# Patient Record
Sex: Female | Born: 1935 | Hispanic: No | Smoking: Never smoker
Health system: Southern US, Community
[De-identification: ages and names within clinical notes are randomized; demographics above are authoritative.]

## PROBLEM LIST (undated history)

## (undated) DIAGNOSIS — E785 Hyperlipidemia, unspecified: Secondary | ICD-10-CM

## (undated) DIAGNOSIS — I10 Essential (primary) hypertension: Secondary | ICD-10-CM

## (undated) DIAGNOSIS — I442 Atrioventricular block, complete: Secondary | ICD-10-CM

## (undated) DIAGNOSIS — H409 Unspecified glaucoma: Secondary | ICD-10-CM

## (undated) DIAGNOSIS — I4892 Unspecified atrial flutter: Secondary | ICD-10-CM

## (undated) HISTORY — DX: Unspecified atrial flutter: I48.92

## (undated) HISTORY — DX: Atrioventricular block, complete: I44.2

## (undated) HISTORY — DX: Hyperlipidemia, unspecified: E78.5

---

## 2019-07-21 ENCOUNTER — Emergency Department (HOSPITAL_COMMUNITY): Payer: Self-pay

## 2019-07-21 ENCOUNTER — Inpatient Hospital Stay (HOSPITAL_COMMUNITY)
Admission: EM | Admit: 2019-07-21 | Discharge: 2019-07-22 | DRG: 103 | Disposition: A | Discharge status: Home / Self Care | Payer: Self-pay | Attending: Internal Medicine | Admitting: Internal Medicine

## 2019-07-21 ENCOUNTER — Other Ambulatory Visit: Payer: Self-pay

## 2019-07-21 ENCOUNTER — Encounter (HOSPITAL_COMMUNITY): Payer: Self-pay

## 2019-07-21 DIAGNOSIS — I10 Essential (primary) hypertension: Secondary | ICD-10-CM | POA: Diagnosis present

## 2019-07-21 DIAGNOSIS — R519 Headache, unspecified: Secondary | ICD-10-CM

## 2019-07-21 DIAGNOSIS — G43909 Migraine, unspecified, not intractable, without status migrainosus: Principal | ICD-10-CM | POA: Diagnosis present

## 2019-07-21 DIAGNOSIS — R4182 Altered mental status, unspecified: Secondary | ICD-10-CM | POA: Diagnosis present

## 2019-07-21 DIAGNOSIS — Z20828 Contact with and (suspected) exposure to other viral communicable diseases: Secondary | ICD-10-CM | POA: Diagnosis present

## 2019-07-21 DIAGNOSIS — E785 Hyperlipidemia, unspecified: Secondary | ICD-10-CM | POA: Diagnosis present

## 2019-07-21 DIAGNOSIS — K219 Gastro-esophageal reflux disease without esophagitis: Secondary | ICD-10-CM | POA: Diagnosis present

## 2019-07-21 DIAGNOSIS — Z79899 Other long term (current) drug therapy: Secondary | ICD-10-CM

## 2019-07-21 DIAGNOSIS — H409 Unspecified glaucoma: Secondary | ICD-10-CM | POA: Diagnosis present

## 2019-07-21 DIAGNOSIS — F0390 Unspecified dementia without behavioral disturbance: Secondary | ICD-10-CM | POA: Diagnosis present

## 2019-07-21 HISTORY — DX: Unspecified glaucoma: H40.9

## 2019-07-21 HISTORY — DX: Essential (primary) hypertension: I10

## 2019-07-21 LAB — URINALYSIS, ROUTINE W REFLEX MICROSCOPIC
Bacteria, UA: NONE SEEN
Bilirubin Urine: NEGATIVE
Glucose, UA: NEGATIVE mg/dL
Ketones, ur: NEGATIVE mg/dL
Nitrite: NEGATIVE
Protein, ur: 30 mg/dL — AB
Specific Gravity, Urine: 1.015 (ref 1.005–1.030)
pH: 7 (ref 5.0–8.0)

## 2019-07-21 LAB — CBC WITH DIFFERENTIAL/PLATELET
Abs Immature Granulocytes: 0.02 10*3/uL (ref 0.00–0.07)
Basophils Absolute: 0 10*3/uL (ref 0.0–0.1)
Basophils Relative: 0 %
Eosinophils Absolute: 0 10*3/uL (ref 0.0–0.5)
Eosinophils Relative: 0 %
HCT: 34.6 % — ABNORMAL LOW (ref 36.0–46.0)
Hemoglobin: 11.8 g/dL — ABNORMAL LOW (ref 12.0–15.0)
Immature Granulocytes: 0 %
Lymphocytes Relative: 24 %
Lymphs Abs: 1.6 10*3/uL (ref 0.7–4.0)
MCH: 30.6 pg (ref 26.0–34.0)
MCHC: 34.1 g/dL (ref 30.0–36.0)
MCV: 89.9 fL (ref 80.0–100.0)
Monocytes Absolute: 0.3 10*3/uL (ref 0.1–1.0)
Monocytes Relative: 5 %
Neutro Abs: 4.7 10*3/uL (ref 1.7–7.7)
Neutrophils Relative %: 71 %
Platelets: 131 10*3/uL — ABNORMAL LOW (ref 150–400)
RBC: 3.85 MIL/uL — ABNORMAL LOW (ref 3.87–5.11)
RDW: 12.4 % (ref 11.5–15.5)
WBC: 6.7 10*3/uL (ref 4.0–10.5)
nRBC: 0 % (ref 0.0–0.2)

## 2019-07-21 LAB — BASIC METABOLIC PANEL
Anion gap: 12 (ref 5–15)
BUN: 11 mg/dL (ref 8–23)
CO2: 27 mmol/L (ref 22–32)
Calcium: 9.5 mg/dL (ref 8.9–10.3)
Chloride: 100 mmol/L (ref 98–111)
Creatinine, Ser: 1.01 mg/dL — ABNORMAL HIGH (ref 0.44–1.00)
GFR calc Af Amer: 58 mL/min — ABNORMAL LOW (ref >60)
GFR calc non Af Amer: 50 mL/min — ABNORMAL LOW (ref >60)
Glucose, Bld: 109 mg/dL — ABNORMAL HIGH (ref 70–99)
Potassium: 4 mmol/L (ref 3.5–5.1)
Sodium: 139 mmol/L (ref 135–145)

## 2019-07-21 LAB — RAPID URINE DRUG SCREEN, HOSP PERFORMED
Amphetamines: NOT DETECTED
Barbiturates: NOT DETECTED
Benzodiazepines: NOT DETECTED
Cocaine: NOT DETECTED
Opiates: POSITIVE — AB
Tetrahydrocannabinol: NOT DETECTED

## 2019-07-21 LAB — PROTIME-INR
INR: 1 (ref 0.8–1.2)
Prothrombin Time: 13.2 seconds (ref 11.4–15.2)

## 2019-07-21 LAB — ETHANOL: Alcohol, Ethyl (B): <10 mg/dL (ref <10)

## 2019-07-21 LAB — APTT: aPTT: 23 seconds — ABNORMAL LOW (ref 24–36)

## 2019-07-21 MED ORDER — SODIUM CHLORIDE 0.9 % IV SOLN
INTRAVENOUS | Status: AC
  Administered 2019-07-21: 21:00:00 via INTRAVENOUS

## 2019-07-21 MED ORDER — PROCHLORPERAZINE EDISYLATE 10 MG/2ML IJ SOLN
10.0000 mg | Freq: Once | INTRAMUSCULAR | Status: DC
  Filled 2019-07-21: qty 2

## 2019-07-21 MED ORDER — LORAZEPAM 2 MG/ML IJ SOLN
1.0000 mg | Freq: Once | INTRAMUSCULAR | Status: AC
  Administered 2019-07-21: 1 mg via INTRAVENOUS
  Filled 2019-07-21: qty 1

## 2019-07-21 MED ORDER — LISINOPRIL 10 MG PO TABS
10.0000 mg | ORAL_TABLET | Freq: Once | ORAL | Status: AC
  Administered 2019-07-21: 10 mg via ORAL
  Filled 2019-07-21: qty 1

## 2019-07-21 MED ORDER — ONDANSETRON HCL 4 MG/2ML IJ SOLN
4.0000 mg | Freq: Once | INTRAMUSCULAR | Status: AC
  Administered 2019-07-21: 4 mg via INTRAVENOUS
  Filled 2019-07-21: qty 2

## 2019-07-21 MED ORDER — DIPHENHYDRAMINE HCL 50 MG/ML IJ SOLN
25.0000 mg | Freq: Once | INTRAMUSCULAR | Status: DC
  Filled 2019-07-21: qty 1

## 2019-07-21 MED ORDER — ACETAMINOPHEN 650 MG RE SUPP: 650.0000 mg | Freq: Four times a day (QID) | RECTAL | Status: DC | PRN

## 2019-07-21 MED ORDER — MORPHINE SULFATE (PF) 4 MG/ML IV SOLN
4.0000 mg | Freq: Once | INTRAVENOUS | Status: AC
  Administered 2019-07-21: 4 mg via INTRAVENOUS
  Filled 2019-07-21: qty 1

## 2019-07-21 MED ORDER — KETOROLAC TROMETHAMINE 15 MG/ML IJ SOLN
15.0000 mg | Freq: Once | INTRAMUSCULAR | Status: AC
  Administered 2019-07-21: 15 mg via INTRAVENOUS
  Filled 2019-07-21: qty 1

## 2019-07-21 MED ORDER — HYDRALAZINE HCL 20 MG/ML IJ SOLN
10.0000 mg | Freq: Once | INTRAMUSCULAR | Status: AC
  Administered 2019-07-21: 10 mg via INTRAVENOUS
  Filled 2019-07-21: qty 1

## 2019-07-21 MED ORDER — ACETAMINOPHEN 325 MG PO TABS: 650.0000 mg | ORAL_TABLET | Freq: Four times a day (QID) | ORAL | Status: DC | PRN

## 2019-07-21 MED ORDER — ENOXAPARIN SODIUM 40 MG/0.4ML ~~LOC~~ SOLN
40.0000 mg | SUBCUTANEOUS | Status: DC
  Filled 2019-07-21 (×2): qty 0.4

## 2019-07-21 MED ORDER — TETRACAINE HCL 0.5 % OP SOLN
2.0000 [drp] | Freq: Once | OPHTHALMIC | Status: AC
  Administered 2019-07-21: 2 [drp] via OPHTHALMIC
  Filled 2019-07-21: qty 4

## 2019-07-21 MED ORDER — SODIUM CHLORIDE 0.9 % IV BOLUS
1000.0000 mL | Freq: Once | INTRAVENOUS | Status: DC
  Administered 2019-07-21: 1000 mL via INTRAVENOUS

## 2019-07-21 NOTE — ED Triage Notes (Signed)
Patient complains of headache with HTN since last night. Daughter reports that patient has not had lisinopril this am. Alert and oriented, no deficits noted, NAD

## 2019-07-21 NOTE — ED Provider Notes (Signed)
Medical screening examination/treatment/procedure(s) were conducted as a shared visit with non-physician practitioner(s) and myself.  I personally evaluated the patient during the encounter.   Pt presents with headache and confusion.   Currently confused but will follow commands, protecting airway. Sx concerning for possible hemorrhage, ischemic stroke.  No signs to suggest infection.  Will proceed with labs, CT scan, evaluate further.   EKG Interpretation  Date/Time:  Sunday July 21 2019 15:13:58 EDT Ventricular Rate:  67 PR Interval:    QRS Duration: 97 QT Interval:  456 QTC Calculation: 482 R Axis:   76 Text Interpretation:  Sinus rhythm Borderline T abnormalities, diffuse leads No old tracing to compare Confirmed by Dorie Rank [id number]) on 07/21/2019 3:15:59 PM         Dorie Rank, MD 07/21/19 2101

## 2019-07-21 NOTE — ED Notes (Signed)
Patient speaks Pinyin language ([place] Origin)

## 2019-07-21 NOTE — H&P (Signed)
Date: 07/21/2019               Patient Name:  Makayla Atkins MRN: [id number]  DOB: 1930/04/12 Age / Sex: 85 y.o., female   PCP: Patient, No Pcp Per         Medical Service: Internal Medicine Teaching Service         Attending Physician: Dr. Sandre Kitty, Elwin Mocha, MD    First Contact: Dr. Barbaraann Faster  Pager: [phone]  Second Contact: Dr. Gwyneth Revels Pager: [phone]       After Hours (After 5p/  First Contact Pager: [phone]  weekends / holidays): Second Contact Pager: [phone]   Chief Complaint: Headache and altered mental status  History of Present Illness:  Makayla Atkins is a 85 y.o. female with a history of HTN, GERD, and glaucoma who presents to the [facility] with headache and AMS. Her AMS started this morning, according to her daughter in law. She reports that patient started having a headache at 9 AM, and started grabbing at her head. Headache was all over, a pounding sensation, denies any vision changes, photophobia, worse with noise. Endorses sensitivity to sound. She has never had a headache like this. May have a history of migraines but isn't sure. Headache is a little better but still present. They checked her blood pressure and it was elevated to 170s-180s systolic and 90-110 diastolic. Normally her Bps are around 120-130s systolic. She has never experienced this before in the past. At baseline she is very forgetful and is oriented to person at baseline. She does not know what day it is, knows what her name is, does not know the year (which is baseline). She has been calmer post administration of Ativan. Denied any urinary issues. Denies any recent back pain, which is chronic. Denies any swelling, myalgias, chest pain, dysuria, visual changes    During her ED course, her CBC, BMP, and U/A unremarkable. CT head negative. Blood cultures collected.   Meds:  No outpatient medications have been marked as taking for the 07/21/19 encounter [facility] Encounter).     Allergies: Allergies as of  07/21/2019  . (No Known Allergies)   Past Medical History:  Diagnosis Date  . Glaucoma   . Hypertension     Family History:    Social History:  - Currently visiting from [place]. Staying with her son and daughter in law. 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  - Pinyin is dialect, pigon English - Poor diet, lots of [employer]  - Denies EtOH use - Denies Illicit drug use - Denies smoking tobacco products or vaping.   Review of Systems: A complete ROS was negative except as per HPI.  Physical Exam: Blood pressure (!) 174/80, pulse 62, temperature 98.6 F (37 C), temperature source Oral, resp. rate (!) 22, SpO2 94 %. Physical Exam Constitutional:      Appearance: She is obese.     Comments: Sedated elderly female, cooperative during physical exam. Answers questions appropriately.   HENT:     Head: Normocephalic and atraumatic.  Eyes:     Pupils: Pupils are equal, round, and reactive to light.     Comments: Could not complete EOM exam due to sedation and possible language barrier.   Cardiovascular:     Rate and Rhythm: Normal rate and regular rhythm.     Pulses: Normal pulses.     Heart sounds: Normal heart sounds. No murmur. No gallop.   Pulmonary:     Effort:  Pulmonary effort is normal.     Breath sounds: Normal breath sounds. No wheezing, rhonchi or rales.  Abdominal:     General: Abdomen is flat. Bowel sounds are normal.     Palpations: Abdomen is soft.     Tenderness: There is no abdominal tenderness.  Musculoskeletal:     Right lower leg: No edema.     Left lower leg: No edema.  Skin:    General: Skin is warm.     Findings: No bruising.     Comments: Hyperpigmented rash noted in the small of her back and terminating at the iliac crest.       EKG: personally reviewed my interpretation is sinus rhythm.  CXR:  IMPRESSION: Bilateral pleural effusions with underlying opacities. Possible mild pulmonary venous congestion.  CT Head:   IMPRESSION: The study is limited due to patient combativeness and motion. Within these limitations, no acute intracranial abnormalities are noted.  Assessment & Plan by Problem: Active Problems:   AMS (altered mental status) Makayla Atkins is a 85 year old female, with PMH of glaucoma, HTN, and GERD that presents with AMS thought to be 2/2 to migraine headache.   She has a history of headaches in the past. She states that during this headache she became sensitive to sound. Her headache is appreciated bilaterally. During this time, her AMS may be a part of her migraine attack, as patients with migraines may have some confusion. While the patient may be oriented to self typically, this sudden change with associated headache may be a migraine in origin. Her labs and imaging due not exhibit any abnormalities. Secondly, this could the effects of hypertensive urgency. The patient's home BP was in the 170s-180/90s-120s.  We will treat her as though she has a migraine with a cocktail and continue to monitor her overnight BPs and watch for changes in mentation.  AMS 2/2 to Migraine:  - Toradol 15 mg IV once ordered - Compazine 10 mg IV ordered - Benadryl 25 IV ordered - CMP AM - CBC AM   HTN:  - While in the ED her Bps have been 138-179/77-91 - Given hydralazine in the ED which improved her Bps into the 150s - Continue zestril 10 mg IV     Dispo: Admit patient to Observation with expected length of stay less than 2 midnights.  Signed: Asencion Noble, MD 07/21/2019, 7:47 PM  Pager: [phone]

## 2019-07-21 NOTE — ED Notes (Addendum)
Patient does not speak english. Patient refusing to lay in bed. Daughter at bedside interpreting that patient should get back in bed for her to rest and patient refuses. patient also refuses to provide urine sample. Patient wishes to remain in chair at this time. Patient placed back on monitor while sitting in chair.

## 2019-07-21 NOTE — ED Notes (Signed)
ED TO INPATIENT HANDOFF REPORT  ED Nurse Name and Phone #: Selena Battenkim [phone]  S Name/Age/Gender Makayla Atkins 85 y.o. female Room/Bed: 045C/045C  Code Status   Code Status: Full Code  Home/SNF/Other Home Patient oriented to: self, place and situation Is this baseline? Yes   Triage Complete: Triage complete  Chief Complaint HTN   Triage Note Patient complains of headache with HTN since last night. Daughter reports that patient has not had lisinopril this am. Alert and oriented, no deficits noted, NAD   Allergies No Known Allergies  Level of Care/Admitting Diagnosis ED Disposition    ED Disposition Condition Comment   Admit  [facility]ospital [facility] [[id number]]  Level of Care: Telemetry Medical [104]  Covid Evaluation: Asymptomatic Screening Protocol (No Symptoms)  Diagnosis: AMS (altered mental status) [[id number]][[id number]]  Admitting Physician: Anne ShutterAINES, ALEXANDER N [[id number]]  Attending Physician: Anne ShutterAINES, ALEXANDER N [[id number]]  Estimated length of stay: past midnight tomorrow  Certification:: I certify this patient will need inpatient services for at least 2 midnights  PT Class (Do Not Modify): Inpatient [101]  PT Acc Code (Do Not Modify): Private [1]       B Medical/Surgery History Past Medical History:  Diagnosis Date  . Glaucoma   . Hypertension    History reviewed. No pertinent surgical history.   A IV Location/Drains/Wounds Patient Lines/Drains/Airways Status   Active Line/Drains/Airways    Name:   Placement date:   Placement time:   Site:   Days:   Peripheral IV 07/21/19 Right Antecubital   07/21/19    1300    Antecubital   less than 1          Intake/Output Last 24 hours  Intake/Output Summary (Last 24 hours) at 07/21/2019 2321 Last data filed at 07/21/2019 1604 Gross per 24 hour  Intake 1000 ml  Output -  Net 1000 ml    Labs/Imaging Results for orders placed or performed during the hospital encounter of 07/21/19 (from the past 48  hour(s))  Protime-INR     Status: None   Collection Time: 07/21/19  1:07 PM  Result Value Ref Range   Prothrombin Time 13.2 11.4 - 15.2 seconds   INR 1.0 0.8 - 1.2    Comment: (NOTE) INR goal varies based on device and disease states. Performed at [facility], [address] [place], [place] [address]   Ethanol     Status: None   Collection Time: 07/21/19  1:07 PM  Result Value Ref Range   Alcohol, Ethyl (B) <10 <10 mg/dL    Comment: (NOTE) Lowest detectable limit for serum alcohol is 10 mg/dL. For medical purposes only. Performed at [facility], [address] [place], [place] [address]   APTT     Status: Abnormal   Collection Time: 07/21/19  1:07 PM  Result Value Ref Range   aPTT 23 (L) 24 - 36 seconds    Comment: Performed at [facility], [address] [place], [place] [address]  CBC with Differential     Status: Abnormal   Collection Time: 07/21/19  1:44 PM  Result Value Ref Range   WBC 6.7 4.0 - 10.5 K/uL   RBC 3.85 (L) 3.87 - 5.11 MIL/uL   Hemoglobin 11.8 (L) 12.0 - 15.0 g/dL   HCT 13.234.6 (L) 44.036.0 - 10.246.0 %   MCV 89.9 80.0 - 100.0 fL   MCH 30.6 26.0 - 34.0 pg   MCHC 34.1 30.0 - 36.0 g/dL  RDW 12.4 11.5 - 15.5 %   Platelets 131 (L) 150 - 400 K/uL    Comment: REPEATED TO VERIFY   nRBC 0.0 0.0 - 0.2 %   Neutrophils Relative % 71 %   Neutro Abs 4.7 1.7 - 7.7 K/uL   Lymphocytes Relative 24 %   Lymphs Abs 1.6 0.7 - 4.0 K/uL   Monocytes Relative 5 %   Monocytes Absolute 0.3 0.1 - 1.0 K/uL   Eosinophils Relative 0 %   Eosinophils Absolute 0.0 0.0 - 0.5 K/uL   Basophils Relative 0 %   Basophils Absolute 0.0 0.0 - 0.1 K/uL   Immature Granulocytes 0 %   Abs Immature Granulocytes 0.02 0.00 - 0.07 K/uL    Comment: Performed at [facility], [address], [place], [place] [address]  Basic metabolic panel     Status: Abnormal   Collection Time: 07/21/19  2:15 PM  Result Value Ref Range   Sodium 139 135 - 145 mmol/L   Potassium 4.0 3.5 -  5.1 mmol/L   Chloride 100 98 - 111 mmol/L   CO2 27 22 - 32 mmol/L   Glucose, Bld 109 (H) 70 - 99 mg/dL   BUN 11 8 - 23 mg/dL   Creatinine, Ser 6.04 (H) 0.44 - 1.00 mg/dL   Calcium 9.5 8.9 - 54.0 mg/dL   GFR calc non Af Amer 50 (L) >60 mL/min   GFR calc Af Amer 58 (L) >60 mL/min   Anion gap 12 5 - 15    Comment: Performed at [facility], [address] [place], [place] [address]  Urine rapid drug screen (hosp performed)     Status: Abnormal   Collection Time: 07/21/19  4:02 PM  Result Value Ref Range   Opiates POSITIVE (A) NONE DETECTED   Cocaine NONE DETECTED NONE DETECTED   Benzodiazepines NONE DETECTED NONE DETECTED   Amphetamines NONE DETECTED NONE DETECTED   Tetrahydrocannabinol NONE DETECTED NONE DETECTED   Barbiturates NONE DETECTED NONE DETECTED    Comment: (NOTE) DRUG SCREEN FOR MEDICAL PURPOSES ONLY.  IF CONFIRMATION IS NEEDED FOR ANY PURPOSE, NOTIFY LAB WITHIN 5 DAYS. LOWEST DETECTABLE LIMITS FOR URINE DRUG SCREEN Drug Class                     Cutoff (ng/mL) Amphetamine and metabolites    1000 Barbiturate and metabolites    200 Benzodiazepine                 200 Tricyclics and metabolites     300 Opiates and metabolites        300 Cocaine and metabolites        300 THC                            50 Performed at [facility], [address], [place], [place] [address]   Urinalysis, Routine w reflex microscopic     Status: Abnormal   Collection Time: 07/21/19  4:02 PM  Result Value Ref Range   Color, Urine YELLOW YELLOW   APPearance CLEAR CLEAR   Specific Gravity, Urine 1.015 1.005 - 1.030   pH 7.0 5.0 - 8.0   Glucose, UA NEGATIVE NEGATIVE mg/dL   Hgb urine dipstick SMALL (A) NEGATIVE   Bilirubin Urine NEGATIVE NEGATIVE   Ketones, ur NEGATIVE NEGATIVE mg/dL   Protein, ur 30 (A) NEGATIVE mg/dL   Nitrite NEGATIVE NEGATIVE  Leukocytes,Ua TRACE (A) NEGATIVE   RBC / HPF 11-20 0 - 5 RBC/hpf   WBC, UA 21-50 0 - 5 WBC/hpf   Bacteria, UA NONE SEEN  NONE SEEN   Squamous Epithelial / LPF 0-5 0 - 5   Mucus PRESENT     Comment: Performed at [facility], [place] [address] [place], [place] [address]   Ct Head Wo Contrast  Result Date: 07/21/2019 CLINICAL DATA:  Acute severe headache.  Worst headache of life. EXAM: CT HEAD WITHOUT CONTRAST TECHNIQUE: Contiguous axial images were obtained from the base of the skull through the vertex without intravenous contrast. COMPARISON:  None. FINDINGS: Brain: Evaluation is limited due to patient motion due to patient combativeness. Within these limitations, no subdural, epidural, or subarachnoid hemorrhage is identified. Cerebellum, brainstem, and basal cisterns are within normal limits. Ventricles and sulci are mildly prominent but otherwise unremarkable. White matter changes are noted. No acute cortical ischemia or infarct is identified. No midline shift identified. Vascular: Calcified atherosclerosis is identified in the intracranial carotids. Skull: Normal. Negative for fracture or focal lesion. Sinuses/Orbits: No acute finding. Other: None. IMPRESSION: The study is limited due to patient combativeness and motion. Within these limitations, no acute intracranial abnormalities are noted. Electronically Signed   By: Dorise Bullion III M.D   On: 07/21/2019 13:20   Dg Chest Portable 1 View  Result Date: 07/21/2019 CLINICAL DATA:  Acute mental status change. EXAM: PORTABLE CHEST 1 VIEW COMPARISON:  None. FINDINGS: Bilateral pleural effusions with underlying opacities are identified. Probable cardiomegaly. The hila and mediastinum are unremarkable. Mild interstitial prominence. IMPRESSION: Bilateral pleural effusions with underlying opacities. Possible mild pulmonary venous congestion. Electronically Signed   By: Dorise Bullion III M.D   On: 07/21/2019 15:42    Pending Labs Unresulted Labs (From admission, onward)    Start     Ordered   07/22/19 0500  Comprehensive metabolic panel  Tomorrow morning,   R      07/21/19 1857   07/22/19 0500  CBC  Tomorrow morning,   R     07/21/19 1857   07/21/19 1700  Urine culture  Add-on,   AD     07/21/19 1659   07/21/19 1130  CBC with Differential/Platelet  Once,   STAT     07/21/19 1130          Vitals/Pain Today's Vitals   07/21/19 2045 07/21/19 2100 07/21/19 2200 07/21/19 2300  BP:  (!) 143/82 (!) 158/67 133/65  Pulse:  74  70  Resp: 16 15 13 18   Temp:      TempSrc:      SpO2:  91%  92%  PainSc:        Isolation Precautions No active isolations  Medications Medications  enoxaparin (LOVENOX) injection 40 mg (40 mg Subcutaneous Not Given 07/21/19 2128)  0.9 %  sodium chloride infusion ( Intravenous New Bag/Given 07/21/19 2129)  acetaminophen (TYLENOL) tablet 650 mg (has no administration in time range)    Or  acetaminophen (TYLENOL) suppository 650 mg (has no administration in time range)  diphenhydrAMINE (BENADRYL) injection 25 mg (0 mg Intravenous Hold 07/21/19 2124)  prochlorperazine (COMPAZINE) injection 10 mg (0 mg Intravenous Hold 07/21/19 2125)  ondansetron (ZOFRAN) injection 4 mg (4 mg Intravenous Given 07/21/19 1310)  tetracaine (PONTOCAINE) 0.5 % ophthalmic solution 2 drop (2 drops Both Eyes Given 07/21/19 1315)  lisinopril (ZESTRIL) tablet 10 mg (10 mg Oral Given 07/21/19 1309)  LORazepam (ATIVAN) injection 1 mg (1 mg Intravenous  Given 07/21/19 1308)  morphine 4 MG/ML injection 4 mg (4 mg Intravenous Given 07/21/19 1310)  hydrALAZINE (APRESOLINE) injection 10 mg (10 mg Intravenous Given 07/21/19 1811)  ketorolac (TORADOL) 15 MG/ML injection 15 mg (15 mg Intravenous Given 07/21/19 2128)    Mobility walks with person assist Moderate fall risk   Focused Assessments Neuro Assessment Handoff:  Swallow screen pass? Yes          Neuro Assessment: Within Defined Limits Neuro Checks:      Last Documented NIHSS Modified Score:   Has TPA been given? No If patient is a Neuro Trauma and patient is going to OR before floor call report  to 4N Charge nurse: [phone] or [phone]     R Recommendations: See Admitting Provider Note  Report given to:   Additional Notes:

## 2019-07-21 NOTE — ED Provider Notes (Signed)
[facility] EMERGENCY DEPARTMENT Provider Note   CSN: [id number] Arrival date & time: 07/21/19  1035     History   Chief Complaint Chief Complaint  Patient presents with  . headache/ HTN    HPI Makayla Atkins is a 85 y.o. female.     Patient is an 85 year old female with past medical history of GERD, hypertension, glaucoma who presents to the emergency department for headache.  Patient does not speak very much [place] and is a poor historian, her daughter-in-law is here to help.  The daughter-in-law states that the patient usually lives in [place] and gets all of her care up there but is staying with her at the moment.  She states that the patient woke up this morning with a severe frontal headache which caused her to be in tears.  She has nausea and eye pain at baseline but these also seem to be intensified.  Her blood pressure at that time was 200s over 100 systolic.  She has not taken her blood pressure medication today.  She only takes lisinopril and some eyedrops and vitamins.  Denies any focal weakness.  Does state that she has pains in her arms and her legs and needed assistance getting to the car today.  Denies any fever, vision changes, chest pain, shortness of breath.  Pain seems to have subsided some at the moment.     Past Medical History:  Diagnosis Date  . Glaucoma   . Hypertension     There are no active problems to display for this patient.   History reviewed. No pertinent surgical history.   OB History   No obstetric history on file.      Home Medications    Prior to Admission medications   Not on File    Family History No family history on file.  Social History Social History   Tobacco Use  . Smoking status: Not on file  Substance Use Topics  . Alcohol use: Not on file  . Drug use: Not on file     Allergies   Patient has no known allergies.   Review of Systems Review of Systems  Constitutional: Negative for  chills, diaphoresis and fever.  HENT: Negative for congestion, rhinorrhea, sinus pressure and sinus pain.   Eyes: Positive for pain and discharge (Clear). Negative for photophobia, redness and visual disturbance.  Respiratory: Negative for cough and shortness of breath.   Cardiovascular: Negative for chest pain.  Gastrointestinal: Positive for nausea. Negative for abdominal pain and vomiting.  Genitourinary: Negative for dysuria.  Musculoskeletal: Positive for arthralgias. Negative for back pain.  Skin: Negative for rash and wound.  Neurological: Positive for headaches. Negative for dizziness, tremors, seizures, syncope, facial asymmetry, speech difficulty, weakness, light-headedness and numbness.  Hematological: Does not bruise/bleed easily.  Psychiatric/Behavioral: Positive for confusion.     Physical Exam Updated Vital Signs BP (!) 174/80   Pulse 62   Temp 98.6 F (37 C) (Oral)   Resp (!) 22   SpO2 94%   Physical Exam Vitals signs and nursing note reviewed.  Constitutional:      General: She is not in acute distress.    Appearance: Normal appearance. She is not ill-appearing, toxic-appearing or diaphoretic.  HENT:     Head: Normocephalic and atraumatic.     Mouth/Throat:     Mouth: Mucous membranes are moist.  Eyes:     Intraocular pressure: Right eye pressure is 18 mmHg. Left eye pressure is 23 mmHg.  Extraocular Movements:     Right eye: Normal extraocular motion and no nystagmus.     Left eye: Normal extraocular motion and no nystagmus.     Pupils: Pupils are equal, round, and reactive to light.     Comments: Pupils are equal round and reactive to light she does have bilateral clear drainage.  There is no ciliary flush or conjunctival injection.  The cornea is clear.  Neurological:     General: No focal deficit present.     Mental Status: She is alert. Mental status is at baseline.     Sensory: No sensory deficit.  Psychiatric:        Mood and Affect: Mood  normal.      ED Treatments / Results  Labs (all labs ordered are listed, but only abnormal results are displayed) Labs Reviewed  RAPID URINE DRUG SCREEN, HOSP PERFORMED - Abnormal; Notable for the following components:      Result Value   Opiates POSITIVE (*)    All other components within normal limits  APTT - Abnormal; Notable for the following components:   aPTT 23 (*)    All other components within normal limits  CBC WITH DIFFERENTIAL/PLATELET - Abnormal; Notable for the following components:   RBC 3.85 (*)    Hemoglobin 11.8 (*)    HCT 34.6 (*)    Platelets 131 (*)    All other components within normal limits  BASIC METABOLIC PANEL - Abnormal; Notable for the following components:   Glucose, Bld 109 (*)    Creatinine, Ser 1.01 (*)    GFR calc non Af Amer 50 (*)    GFR calc Af Amer 58 (*)    All other components within normal limits  URINALYSIS, ROUTINE W REFLEX MICROSCOPIC - Abnormal; Notable for the following components:   Hgb urine dipstick SMALL (*)    Protein, ur 30 (*)    Leukocytes,Ua TRACE (*)    All other components within normal limits  URINE CULTURE  PROTIME-INR  ETHANOL  CBC WITH DIFFERENTIAL/PLATELET    EKG EKG Interpretation  Date/Time:  Sunday July 21 2019 15:13:58 EDT Ventricular Rate:  67 PR Interval:    QRS Duration: 97 QT Interval:  456 QTC Calculation: 482 R Axis:   76 Text Interpretation:  Sinus rhythm Borderline T abnormalities, diffuse leads No old tracing to compare Confirmed by Dorie Rank [phone]) on 07/21/2019 3:15:59 PM   Radiology Ct Head Wo Contrast  Result Date: 07/21/2019 CLINICAL DATA:  Acute severe headache.  Worst headache of life. EXAM: CT HEAD WITHOUT CONTRAST TECHNIQUE: Contiguous axial images were obtained from the base of the skull through the vertex without intravenous contrast. COMPARISON:  None. FINDINGS: Brain: Evaluation is limited due to patient motion due to patient combativeness. Within these limitations, no  subdural, epidural, or subarachnoid hemorrhage is identified. Cerebellum, brainstem, and basal cisterns are within normal limits. Ventricles and sulci are mildly prominent but otherwise unremarkable. White matter changes are noted. No acute cortical ischemia or infarct is identified. No midline shift identified. Vascular: Calcified atherosclerosis is identified in the intracranial carotids. Skull: Normal. Negative for fracture or focal lesion. Sinuses/Orbits: No acute finding. Other: None. IMPRESSION: The study is limited due to patient combativeness and motion. Within these limitations, no acute intracranial abnormalities are noted. Electronically Signed   By: Dorise Bullion III M.D   On: 07/21/2019 13:20   Dg Chest Portable 1 View  Result Date: 07/21/2019 CLINICAL DATA:  Acute mental status change. EXAM: PORTABLE CHEST  1 VIEW COMPARISON:  None. FINDINGS: Bilateral pleural effusions with underlying opacities are identified. Probable cardiomegaly. The hila and mediastinum are unremarkable. Mild interstitial prominence. IMPRESSION: Bilateral pleural effusions with underlying opacities. Possible mild pulmonary venous congestion. Electronically Signed   By: Gerome Samavid  Williams III M.D   On: 07/21/2019 15:42    Procedures Procedures (including critical care time)  Medications Ordered in ED Medications  ondansetron (ZOFRAN) injection 4 mg (4 mg Intravenous Given 07/21/19 1310)  tetracaine (PONTOCAINE) 0.5 % ophthalmic solution 2 drop (2 drops Both Eyes Given 07/21/19 1315)  lisinopril (ZESTRIL) tablet 10 mg (10 mg Oral Given 07/21/19 1309)  LORazepam (ATIVAN) injection 1 mg (1 mg Intravenous Given 07/21/19 1308)  morphine 4 MG/ML injection 4 mg (4 mg Intravenous Given 07/21/19 1310)  hydrALAZINE (APRESOLINE) injection 10 mg (10 mg Intravenous Given 07/21/19 1811)     Initial Impression / Assessment and Plan / ED Course  I have reviewed the triage vital signs and the nursing notes.  Pertinent labs & imaging  results that were available during my care of the patient were reviewed by me and considered in my medical decision making (see chart for details).  Clinical Course as of Jul 20 1816  Wynelle LinkSun Jul 21, 2019  1140 Acute onset of severe headache in a elderly patient with hypertension and glaucoma.  Differential includes subarachnoid hemorrhage, acute angle glaucoma, hypertensive emergency.  Patient is neurovascularly intact.  Physical exam reveals normal conjunctiva bilaterally.  Work-up will include head CT, labs, assessment of intraocular pressures.  Pain is slightly improved on its own since this morning.  Will start fluids and Zofran.  Patient in no acute distress at this moment.   [KM]  1216 Patient was initially cooperative with my exam.  Patient returned from CT scanner and was combative and trying to undress and get off the stretcher.  She now is altered according to the daughter-in-law and combative which is not her usual nature.  Dr. Lynelle DoctorKnapp aware of the patient and to see the patient now.  Ativan and morphine ordered for the patient.   [KM]  1324 Patient sitting up in chair and has just received her meds    [KM]  1711 No etiology for patients AMS or headache. CT scan and labs normal, normal IOPs. She is resting comfortably after ativan and morphine but is still altered. Discussed with Dr. Lynelle DoctorKnapp. Will admit for further workup and observation. Her blood pressure has actually been increased despite pain controlled and lisinopril. Will give IV hydralazine. Her baseline, according to daughter in law is 130/80s. ? Hypertensive urgency, IV hydralazine ordered.    [KM]  1745 Spoke with hospitalist team to admit the patient.   [KM]    Clinical Course User Index [KM] Arlyn DunningMcLean, Ema Hebner A, PA-C       The patient appears reasonably stabilized for admission considering the current resources, flow, and capabilities available in the ED at this time, and I doubt any other [facility] requiring further screening and/or  treatment in the ED prior to admission.   Final Clinical Impressions(s) / ED Diagnoses   Final diagnoses:  Altered mental status, unspecified altered mental status type  Acute nonintractable headache, unspecified headache type    ED Discharge Orders    None       Jeral PinchMcLean, Ioma Chismar A, PA-C 07/21/19 1817    Linwood DibblesKnapp, Jon, MD 07/21/19 2100

## 2019-07-22 DIAGNOSIS — F0390 Unspecified dementia without behavioral disturbance: Secondary | ICD-10-CM

## 2019-07-22 DIAGNOSIS — G43909 Migraine, unspecified, not intractable, without status migrainosus: Secondary | ICD-10-CM | POA: Diagnosis present

## 2019-07-22 LAB — CBC
HCT: 35.4 % — ABNORMAL LOW (ref 36.0–46.0)
Hemoglobin: 11.6 g/dL — ABNORMAL LOW (ref 12.0–15.0)
MCH: 29.8 pg (ref 26.0–34.0)
MCHC: 32.8 g/dL (ref 30.0–36.0)
MCV: 91 fL (ref 80.0–100.0)
Platelets: 148 10*3/uL — ABNORMAL LOW (ref 150–400)
RBC: 3.89 MIL/uL (ref 3.87–5.11)
RDW: 12.6 % (ref 11.5–15.5)
WBC: 6.8 10*3/uL (ref 4.0–10.5)
nRBC: 0 % (ref 0.0–0.2)

## 2019-07-22 LAB — COMPREHENSIVE METABOLIC PANEL
ALT: 16 U/L (ref 0–44)
AST: 30 U/L (ref 15–41)
Albumin: 3.6 g/dL (ref 3.5–5.0)
Alkaline Phosphatase: 50 U/L (ref 38–126)
Anion gap: 9 (ref 5–15)
BUN: 8 mg/dL (ref 8–23)
CO2: 28 mmol/L (ref 22–32)
Calcium: 8.5 mg/dL — ABNORMAL LOW (ref 8.9–10.3)
Chloride: 101 mmol/L (ref 98–111)
Creatinine, Ser: 0.91 mg/dL (ref 0.44–1.00)
GFR calc Af Amer: >60 mL/min (ref >60)
GFR calc non Af Amer: 57 mL/min — ABNORMAL LOW (ref >60)
Glucose, Bld: 109 mg/dL — ABNORMAL HIGH (ref 70–99)
Potassium: 3.9 mmol/L (ref 3.5–5.1)
Sodium: 138 mmol/L (ref 135–145)
Total Bilirubin: 1.6 mg/dL — ABNORMAL HIGH (ref 0.3–1.2)
Total Protein: 6.3 g/dL — ABNORMAL LOW (ref 6.5–8.1)

## 2019-07-22 LAB — SARS CORONAVIRUS 2 (TAT 6-24 HRS): SARS Coronavirus 2: NEGATIVE

## 2019-07-22 MED ORDER — ONDANSETRON HCL 4 MG PO TABS
4.0000 mg | ORAL_TABLET | Freq: Three times a day (TID) | ORAL | Status: DC | PRN
  Administered 2019-07-22: 4 mg via ORAL
  Filled 2019-07-22: qty 1

## 2019-07-22 MED ORDER — ATORVASTATIN CALCIUM 20 MG PO TABS: 20.0000 mg | ORAL_TABLET | Freq: Every day | ORAL | 0 refills | Status: DC

## 2019-07-22 MED ORDER — ONDANSETRON HCL 4 MG PO TABS: 4.0000 mg | ORAL_TABLET | Freq: Three times a day (TID) | ORAL | 0 refills | Status: DC | PRN

## 2019-07-22 MED ORDER — OMEPRAZOLE 20 MG PO CPDR: 20.0000 mg | DELAYED_RELEASE_CAPSULE | Freq: Every day | ORAL | 0 refills | Status: DC

## 2019-07-22 MED ORDER — PANTOPRAZOLE SODIUM 40 MG PO TBEC
40.0000 mg | DELAYED_RELEASE_TABLET | Freq: Every day | ORAL | Status: DC
  Administered 2019-07-22: 10:00:00 40 mg via ORAL
  Filled 2019-07-22: qty 1

## 2019-07-22 MED ORDER — LISINOPRIL 20 MG PO TABS: 20.0000 mg | ORAL_TABLET | Freq: Every day | ORAL | 0 refills | Status: DC

## 2019-07-22 NOTE — Progress Notes (Signed)
Pt arrived unit safely 

## 2019-07-22 NOTE — TOC Transition Note (Signed)
Transition of Care [facility]) - CM/SW Discharge Note   Patient Details  Name: Tyffani Foglesong MRN: [id number] Date of Birth: [birth year]  [facility] CM/SW Contact:  Pollie Friar, RN Phone Number: 07/22/2019, 12:24 PM   Clinical Narrative:    Pt discharging home with self care. Pt without insurance. [facility] will pick her up in the office.  CM provide Good Rx card for assistance with d/c medications. Daughter in law to provide transport home.    Final next level of care: Home/Self Care Barriers to Discharge: Inadequate or no insurance, Barriers Unresolved (comment)   Patient Goals and CMS Choice        Discharge Placement                       Discharge Plan and Services                                     Social Determinants of Health (SDOH) Interventions     Readmission Risk Interventions No flowsheet data found.

## 2019-07-22 NOTE — ED Notes (Signed)
Report given to Will, RN.

## 2019-07-22 NOTE — Discharge Instructions (Signed)
Makayla Atkins,   It has been a pleasure working with you and we are glad you're feeling better. You were hospitalized for headaches and altered mental status. We checked some labs and did an image of your brain and that all looked normal. You were treated with some medications to help with your headache and you seem better.   Follow up with your primary care provider in 1-2 weeks  If your symptoms worsen or you develop new symptoms, please seek medical help whether it is your primary care provider or emergency department.  If you have any questions about this hospitalization please call [phone].

## 2019-07-22 NOTE — Progress Notes (Signed)
Pt d/c home after coming from the ED. Two hours on the unit. Pt is stable with no new concerns and on room air. Discharge instructions done with teach back., pt's family verbalize understanding. Pt will be transported out of the hospital by family

## 2019-07-22 NOTE — Progress Notes (Signed)
   Subjective:  Daughter in law at bedside, interprets conversation. Patient reports that she is much better today. Mild headache and having some heartburn, reports this is chronic. Controlled on omeprazole.   Objective:  Vital signs in last 24 hours: Vitals:   07/22/19 0530 07/22/19 0545 07/22/19 0600 07/22/19 0615  BP:  (!) 110/58 129/60   Pulse: (!) 59 (!) 58 (!) 53 (!) 48  Resp: 19 17 19 15   Temp:      TempSrc:      SpO2: 98% 98% 97% 100%   Physical Exam Constitutional:      Appearance: Normal appearance.  HENT:     Head: Normocephalic and atraumatic.  Cardiovascular:     Rate and Rhythm: Normal rate and regular rhythm.     Heart sounds: No murmur. No friction rub. No gallop.   Abdominal:     General: Abdomen is flat. Bowel sounds are normal.     Palpations: Abdomen is soft.     Comments: Epigastric pain, not worsened with palpation  Neurological:     Mental Status: She is alert.      Assessment/Plan:  Active Problems:   AMS (altered mental status)  Makayla Atkins is a 85 year old female, with PMH of glaucoma, HTN, GERD, and dementia  who presented with headache and AMS.   #Headache/ AMS Patient presented with a headache and AMS. This morning she is back at her baseline per Makayla Atkins. Patient reports mild headache, much improved. Patient received Toradol in ED and BP controlled with hydralazine. CT head did not show any acute bleed. Patient is likely discharge today with improvement and will need follow-up with a PCP. She is living with her son and daughter- in- law since COVID-19 Pandemic, and not seen her PCP in [place]. - Continue Lisinopril   #GERD -protonix  Diet: Regular VTE ppx: Lovenox Code: Full  Dispo: Anticipated discharge today.   Tamsen Snider, MD PGY1  [phone]

## 2019-07-23 LAB — URINE CULTURE

## 2019-07-25 NOTE — Discharge Summary (Addendum)
Name: Makayla Atkins MRN: [id number] DOB: [birth year] 85 y.o. PCP: Patient, No Pcp Per  Date of Admission: 07/21/2019 10:41 AM Date of Discharge: 07/22/2019 Attending Physician: Lenice Pressman MD  Discharge Diagnosis: 1. AMS secondary to migraine headache  Discharge Medications: Allergies as of 07/22/2019   No Known Allergies      Medication List     TAKE these medications    acetaminophen 325 MG tablet Commonly known as: TYLENOL Take 650 mg by mouth every 6 (six) hours as needed for mild pain.   aspirin EC 81 MG tablet Take 81 mg by mouth daily.   atorvastatin 20 MG tablet Commonly known as: Lipitor Take 1 tablet (20 mg total) by mouth daily.   calcium-vitamin D 250-125 MG-UNIT tablet Commonly known as: OSCAL WITH D Take 1 tablet by mouth daily.   docusate sodium 100 MG capsule Commonly known as: COLACE Take 100 mg by mouth daily.   FISH OIL PO Take 1 capsule by mouth daily.   lisinopril 20 MG tablet Commonly known as: ZESTRIL Take 1 tablet (20 mg total) by mouth daily.   multivitamin with minerals Tabs tablet Take 1 tablet by mouth daily.   omeprazole 20 MG capsule Commonly known as: PRILOSEC Take 1 capsule (20 mg total) by mouth daily.   ondansetron 4 MG tablet Commonly known as: ZOFRAN Take 1 tablet (4 mg total) by mouth every 8 (eight) hours as needed for nausea or vomiting.   tetracaine 0.5 % ophthalmic solution Commonly known as: PONTOCAINE Place 1 drop into both eyes daily.        Disposition and follow-up:   [name] was discharged from [facility] in Stable condition.  At the hospital follow up visit please address:  1.  Migraine   - Patient came in with altered mental status secondary to a migraine. Given Toradol and improved overnight.             - Has baseline dementia, but was combative in ED, daughter in law says this was abdnormal  - Follow up on any recent headaches        Routine Healthcare         - Patient has HTN , HLD, GERD. Seen in [place], but living in [place] since [place].            - Given refills until she can establish with PCP     2.  Labs / imaging needed at time of follow-up:   3.  Pending labs/ test needing follow-up:   Follow-up Appointments: Follow-up Information     [place]. Schedule an appointment as soon as possible for a visit in 1 week(s).   Contact information: [address] [place] Hospital Course by problem list: 1.  Headache/ AMS Patient presented with a headache and AMS. CT head did not show any acute changes. SBP elevated to 170's , controlled with hydralazine.Given Toradol in ED and next morning she was back at her baseline.   2. HTN Treated with hydralazine on admission and restarted home medications with improvement. Refilled home medications as she is staying in [place] for now  Discharge Vitals:   BP (!) 129/47 (BP Location: Left Arm)    Pulse 68    Temp 98.8 F (37.1 C) (Oral)    Resp 20    SpO2 94%   Pertinent Labs, Studies, and  Procedures:  CBC Latest Ref Rng & Units 07/22/2019 07/21/2019  WBC 4.0 - 10.5 K/uL 6.8 6.7  Hemoglobin 12.0 - 15.0 g/dL 11.6(L) 11.8(L)  Hematocrit 36.0 - 46.0 % 35.4(L) 34.6(L)  Platelets 150 - 400 K/uL 148(L) 131(L)   BMP Latest Ref Rng & Units 07/22/2019 07/21/2019  Glucose 70 - 99 mg/dL 161(W) 960(A)  BUN 8 - 23 mg/dL 8 11  Creatinine 5.40 - 1.00 mg/dL 9.81 1.91(Y)  Sodium 782 - 145 mmol/L 138 139  Potassium 3.5 - 5.1 mmol/L 3.9 4.0  Chloride 98 - 111 mmol/L 101 100  CO2 22 - 32 mmol/L 28 27  Calcium 8.9 - 10.3 mg/dL 9.5(A) 9.5     FINDINGS: Brain: Evaluation is limited due to patient motion due to patient combativeness. Within these limitations, no subdural, epidural, or subarachnoid hemorrhage is identified. Cerebellum, brainstem, and basal cisterns are within normal limits. Ventricles and sulci  are mildly prominent but otherwise unremarkable. White matter changes are noted. No acute cortical ischemia or infarct is identified. No midline shift identified.   Vascular: Calcified atherosclerosis is identified in the intracranial carotids.   Skull: Normal. Negative for fracture or focal lesion.   Sinuses/Orbits: No acute finding.   Other: None.   IMPRESSION: The study is limited due to patient combativeness and motion. Within these limitations, no acute intracranial abnormalities are noted.    Discharge Instructions: Discharge Instructions     Call MD for:  difficulty breathing, headache or visual disturbances   Complete by: As directed    Call MD for:  extreme fatigue   Complete by: As directed    Call MD for:  hives   Complete by: As directed    Call MD for:  persistant dizziness or light-headedness   Complete by: As directed    Call MD for:  persistant dizziness or light-headedness   Complete by: As directed    Call MD for:  persistant nausea and vomiting   Complete by: As directed    Call MD for:  persistant nausea and vomiting   Complete by: As directed    Call MD for:  redness, tenderness, or signs of infection (pain, swelling, redness, odor or green/yellow discharge around incision site)   Complete by: As directed    Call MD for:  severe uncontrolled pain   Complete by: As directed    Call MD for:  temperature >100.4   Complete by: As directed    Diet - low sodium heart healthy   Complete by: As directed    Diet - low sodium heart healthy   Complete by: As directed    Diet - low sodium heart healthy   Complete by: As directed    Discharge instructions   Complete by: As directed    Increase activity slowly   Complete by: As directed    Increase activity slowly   Complete by: As directed    Increase activity slowly   Complete by: As directed        Signed: Thurmon Fair, MD PGY1  [phone]

## 2019-07-30 ENCOUNTER — Encounter: Payer: Self-pay | Admitting: Internal Medicine

## 2019-07-30 DIAGNOSIS — I10 Essential (primary) hypertension: Secondary | ICD-10-CM | POA: Insufficient documentation

## 2019-07-31 ENCOUNTER — Ambulatory Visit: Payer: Self-pay

## 2019-08-06 ENCOUNTER — Ambulatory Visit (INDEPENDENT_AMBULATORY_CARE_PROVIDER_SITE_OTHER): Payer: Self-pay | Admitting: Internal Medicine

## 2019-08-06 ENCOUNTER — Other Ambulatory Visit: Payer: Self-pay

## 2019-08-06 ENCOUNTER — Encounter: Payer: Self-pay | Admitting: Internal Medicine

## 2019-08-06 ENCOUNTER — Ambulatory Visit (HOSPITAL_COMMUNITY)
Admission: RE | Admit: 2019-08-06 | Discharge: 2019-08-06 | Disposition: A | Discharge status: Home / Self Care | Payer: Self-pay | Source: Ambulatory Visit | Attending: Family Medicine | Admitting: Family Medicine

## 2019-08-06 VITALS — BP 164/77 | HR 51 | Temp 98.3°F | Ht 64.0 in | Wt 163.9 lb

## 2019-08-06 DIAGNOSIS — I10 Essential (primary) hypertension: Secondary | ICD-10-CM

## 2019-08-06 DIAGNOSIS — I4892 Unspecified atrial flutter: Secondary | ICD-10-CM | POA: Insufficient documentation

## 2019-08-06 DIAGNOSIS — I483 Typical atrial flutter: Secondary | ICD-10-CM | POA: Insufficient documentation

## 2019-08-06 DIAGNOSIS — M1710 Unilateral primary osteoarthritis, unspecified knee: Secondary | ICD-10-CM

## 2019-08-06 DIAGNOSIS — Z7901 Long term (current) use of anticoagulants: Secondary | ICD-10-CM

## 2019-08-06 DIAGNOSIS — R319 Hematuria, unspecified: Secondary | ICD-10-CM

## 2019-08-06 DIAGNOSIS — E785 Hyperlipidemia, unspecified: Secondary | ICD-10-CM

## 2019-08-06 DIAGNOSIS — M170 Bilateral primary osteoarthritis of knee: Secondary | ICD-10-CM

## 2019-08-06 DIAGNOSIS — Z79899 Other long term (current) drug therapy: Secondary | ICD-10-CM

## 2019-08-06 DIAGNOSIS — M1990 Unspecified osteoarthritis, unspecified site: Secondary | ICD-10-CM

## 2019-08-06 DIAGNOSIS — R001 Bradycardia, unspecified: Secondary | ICD-10-CM

## 2019-08-06 MED ORDER — DICLOFENAC SODIUM 1 % TD GEL: 2.0000 g | Freq: Four times a day (QID) | TRANSDERMAL | 1 refills | Status: DC

## 2019-08-06 MED ORDER — RIVAROXABAN 20 MG PO TABS: 20.0000 mg | ORAL_TABLET | Freq: Every day | ORAL | 3 refills | Status: DC

## 2019-08-06 MED ORDER — ATORVASTATIN CALCIUM 40 MG PO TABS: 40.0000 mg | ORAL_TABLET | Freq: Every day | ORAL | 3 refills | Status: DC

## 2019-08-06 MED FILL — XARELTO 20 MG TABLET: 20 | 30 days supply | Qty: 30 | Fill #0

## 2019-08-06 NOTE — Progress Notes (Signed)
   CC: HTN, HLD, Hematuria, Arthritis   HPI:  [name] is a 85 y.o. female with PMHx listed below presenting for HTN, HLD, Hematuria, Arthritis. Please see the A&P for the status of the patient's chronic medical problems.  Past Medical History:  Diagnosis Date  . Glaucoma   . Hypertension    Review of Systems:  Performed and all others negative.  Physical Exam: Vitals:   08/06/19 1433  BP: (!) 164/77  Pulse: (!) 51  Temp: 98.3 F (36.8 C)  TempSrc: Oral  SpO2: 100%  Weight: 163 lb 14.4 oz (74.3 kg)  Height: 5\' 4"  (1.626 m)   General: Well nourished female in no acute distress HENT: Normocephalic, atraumatic, moist mucus membranes Pulm: Good air movement with no wheezing or crackles  CV: Bradycardic but regular rhythm, no murmurs, no rubs  Abdomen: Active bowel sounds, soft, non-distended, no tenderness to palpation  Extremities: No LE edema, crepitus appreciated with active/passive flexion/extension of the bilateral knees. Skin: Warm and dry  Neuro: Alert and oriented x 3  Assessment & Plan:   See Encounters Tab for problem based charting.  Patient discussed with Dr. Evette Doffing

## 2019-08-06 NOTE — Patient Instructions (Addendum)
Thank you for allowing [place] to provide your care. Today we are:   1) Checking some blood work to make sure your Atorvastatin is working.   2) Starting a cream that should help your knee pain.   3) Increasing your lisinopril to 40 mg once a day   4) Your heart is in an abnormal rhythm. We are starting you on a medication called Xarelto to decrease the risk of a stroke. Please take this as prescribed.   Please come back to see [place] in 3 months or sooner if any issues arise.

## 2019-08-06 NOTE — Assessment & Plan Note (Addendum)
Patient noted to be bradycardic on intake and PE. EKG subsequently revealed typical atrial flutter with a 4 to 1 block. She is currently asymptomatic and denies shortness of breath, dyspnea on exertion, chest pain, pre-/syncope. Her CHADsVASc is 4 and her HASBLED is 1 (relative low risk of major bleed).   Her son tells me that she lives a fairly sedentary lifestyle however is independent in all her ADLs. It is important for her to remain independent. We discussed the risks of an embolic event versus a major bleed. The patient, her son, and myself agree to starting anticoagulation.  A/P: - Typical Atrial Flutter - Start Xarelto 20 mg QD. Discussed signs and symptoms of bleeding. - Will need echocardiogram. She is working on getting the orange card.

## 2019-08-07 DIAGNOSIS — M1710 Unilateral primary osteoarthritis, unspecified knee: Secondary | ICD-10-CM | POA: Insufficient documentation

## 2019-08-07 DIAGNOSIS — R319 Hematuria, unspecified: Secondary | ICD-10-CM | POA: Insufficient documentation

## 2019-08-07 LAB — MICROSCOPIC EXAMINATION
Bacteria, UA: NONE SEEN
Casts: NONE SEEN /lpf

## 2019-08-07 LAB — URINALYSIS, ROUTINE W REFLEX MICROSCOPIC
Bilirubin, UA: NEGATIVE
Glucose, UA: NEGATIVE
Ketones, UA: NEGATIVE
Leukocytes,UA: NEGATIVE
Nitrite, UA: NEGATIVE
RBC, UA: NEGATIVE
Specific Gravity, UA: 1.016 (ref 1.005–1.030)
Urobilinogen, Ur: 1 mg/dL (ref 0.2–1.0)
pH, UA: 8.5 — ABNORMAL HIGH (ref 5.0–7.5)

## 2019-08-07 LAB — LIPID PANEL
Chol/HDL Ratio: 8.8 ratio — ABNORMAL HIGH (ref 0.0–4.4)
Cholesterol, Total: 202 mg/dL — ABNORMAL HIGH (ref 100–199)
HDL: 23 mg/dL — ABNORMAL LOW (ref >39)
LDL Chol Calc (NIH): 111 mg/dL — ABNORMAL HIGH (ref 0–99)
Triglycerides: 393 mg/dL — ABNORMAL HIGH (ref 0–149)
VLDL Cholesterol Cal: 68 mg/dL — ABNORMAL HIGH (ref 5–40)

## 2019-08-07 MED ORDER — LISINOPRIL 40 MG PO TABS: 40.0000 mg | ORAL_TABLET | Freq: Every day | ORAL | 3 refills | Status: DC

## 2019-08-07 NOTE — Assessment & Plan Note (Signed)
Patient with a history of hyperlipidemia. Currently on atorvastatin 40 mg daily. Will check a lipid panel today.

## 2019-08-07 NOTE — Progress Notes (Signed)
Internal Medicine Clinic Attending  Case discussed with Dr. Helberg at the time of the visit.  We reviewed the resident's history and exam and pertinent patient test results.  I agree with the assessment, diagnosis, and plan of care documented in the resident's note.    

## 2019-08-07 NOTE — Assessment & Plan Note (Addendum)
During the patient's last admission her UA was positive for hematuria, pyuria, and proteinuria. She denies increased frequency or pain with urination. We will recheck a UA today.

## 2019-08-07 NOTE — Assessment & Plan Note (Signed)
HPI:  Patient voices concerns about bilateral knee pain. She states that her knees limit her mobility and are very painful to walk on. This is been the case for several years. She does not take any over-the-counter medications to try to alleviate her pain. On physical exam she is crepitus with active and passive range of motion.  A/P: - Bilateral patellofemoral and lateral arthritis - Given we are increasing the patient lisinopril we will hold off on oral NSAIDs. Will try Voltaren gel

## 2019-08-07 NOTE — Assessment & Plan Note (Signed)
HPI: Patient was recently admitted for new headache. She was found to have a systolic blood pressure greater than 200 at the time of admission. CT head was negative. With antihypertensives her blood pressure came down and her headache subsided. She was subsequently discharged on lisinopril 20 mg daily.  Since discharge she has had no recurrence in her head pain. She denies visual changes, shortness of breath, chest pain, abdominal pain, changes in urination. She has been taking her lisinopril as prescribed.  A/P: - Uncontrolled  - Will increase Lisinopril to 40 mg QD - Will need a repeat BMP in 3-4 weeks.

## 2019-08-21 ENCOUNTER — Ambulatory Visit: Payer: Self-pay

## 2019-08-28 ENCOUNTER — Telehealth: Payer: Self-pay | Admitting: *Deleted

## 2019-08-28 NOTE — Telephone Encounter (Signed)
Pt's daughter in law calls and states pt is c/o hurting all over, being weak, she checked BP and HR bp 140/60's HR 40's She is advised to call 911 or go to ED asap.she is agreeable

## 2019-08-28 NOTE — Telephone Encounter (Signed)
thanks

## 2019-08-29 ENCOUNTER — Other Ambulatory Visit: Payer: Self-pay | Admitting: Internal Medicine

## 2019-08-29 ENCOUNTER — Other Ambulatory Visit: Payer: Self-pay | Admitting: *Deleted

## 2019-08-29 MED ORDER — OMEPRAZOLE 20 MG PO CPDR: 20.0000 mg | DELAYED_RELEASE_CAPSULE | Freq: Every day | ORAL | 1 refills | Status: DC

## 2019-08-29 MED FILL — XARELTO 20 MG TABLET: 20 | 30 days supply | Qty: 30 | Fill #0

## 2019-08-29 NOTE — Telephone Encounter (Signed)
Refill Request  omeprazole (PRILOSEC) 20 MG capsule  Pt requesting medication be called in at the [facility].

## 2019-08-30 ENCOUNTER — Inpatient Hospital Stay (HOSPITAL_COMMUNITY)
Admission: EM | Admit: 2019-08-30 | Discharge: 2019-09-01 | DRG: 244 | Disposition: A | Discharge status: Home / Self Care | Payer: Self-pay | Attending: Internal Medicine | Admitting: Internal Medicine

## 2019-08-30 ENCOUNTER — Other Ambulatory Visit: Payer: Self-pay

## 2019-08-30 ENCOUNTER — Emergency Department (HOSPITAL_COMMUNITY): Payer: Self-pay

## 2019-08-30 ENCOUNTER — Emergency Department (INDEPENDENT_AMBULATORY_CARE_PROVIDER_SITE_OTHER): Payer: Self-pay

## 2019-08-30 ENCOUNTER — Encounter (HOSPITAL_COMMUNITY): Payer: Self-pay | Admitting: *Deleted

## 2019-08-30 DIAGNOSIS — H409 Unspecified glaucoma: Secondary | ICD-10-CM | POA: Diagnosis present

## 2019-08-30 DIAGNOSIS — Z7982 Long term (current) use of aspirin: Secondary | ICD-10-CM

## 2019-08-30 DIAGNOSIS — I4891 Unspecified atrial fibrillation: Secondary | ICD-10-CM | POA: Diagnosis present

## 2019-08-30 DIAGNOSIS — E785 Hyperlipidemia, unspecified: Secondary | ICD-10-CM | POA: Diagnosis present

## 2019-08-30 DIAGNOSIS — I442 Atrioventricular block, complete: Principal | ICD-10-CM | POA: Diagnosis present

## 2019-08-30 DIAGNOSIS — Z79899 Other long term (current) drug therapy: Secondary | ICD-10-CM

## 2019-08-30 DIAGNOSIS — Z23 Encounter for immunization: Secondary | ICD-10-CM

## 2019-08-30 DIAGNOSIS — I441 Atrioventricular block, second degree: Secondary | ICD-10-CM

## 2019-08-30 DIAGNOSIS — Z7901 Long term (current) use of anticoagulants: Secondary | ICD-10-CM

## 2019-08-30 DIAGNOSIS — R079 Chest pain, unspecified: Secondary | ICD-10-CM

## 2019-08-30 DIAGNOSIS — Z20828 Contact with and (suspected) exposure to other viral communicable diseases: Secondary | ICD-10-CM | POA: Diagnosis present

## 2019-08-30 DIAGNOSIS — I484 Atypical atrial flutter: Secondary | ICD-10-CM | POA: Diagnosis present

## 2019-08-30 DIAGNOSIS — Z959 Presence of cardiac and vascular implant and graft, unspecified: Secondary | ICD-10-CM

## 2019-08-30 DIAGNOSIS — I119 Hypertensive heart disease without heart failure: Secondary | ICD-10-CM | POA: Diagnosis present

## 2019-08-30 LAB — BASIC METABOLIC PANEL
Anion gap: 14 (ref 5–15)
BUN: 18 mg/dL (ref 8–23)
CO2: 24 mmol/L (ref 22–32)
Calcium: 9.1 mg/dL (ref 8.9–10.3)
Chloride: 103 mmol/L (ref 98–111)
Creatinine, Ser: 1.07 mg/dL — ABNORMAL HIGH (ref 0.44–1.00)
GFR calc Af Amer: 54 mL/min — ABNORMAL LOW (ref >60)
GFR calc non Af Amer: 47 mL/min — ABNORMAL LOW (ref >60)
Glucose, Bld: 111 mg/dL — ABNORMAL HIGH (ref 70–99)
Potassium: 3.5 mmol/L (ref 3.5–5.1)
Sodium: 141 mmol/L (ref 135–145)

## 2019-08-30 LAB — TROPONIN I (HIGH SENSITIVITY)
Troponin I (High Sensitivity): 77 ng/L — ABNORMAL HIGH (ref <18)
Troponin I (High Sensitivity): 209 ng/L (ref <18)

## 2019-08-30 LAB — ECHOCARDIOGRAM LIMITED
Height: 63 in
Weight: 2800 oz

## 2019-08-30 LAB — SURGICAL PCR SCREEN
MRSA, PCR: NEGATIVE
Staphylococcus aureus: NEGATIVE

## 2019-08-30 LAB — CBC
HCT: 36 % (ref 36.0–46.0)
Hemoglobin: 11.7 g/dL — ABNORMAL LOW (ref 12.0–15.0)
MCH: 29.9 pg (ref 26.0–34.0)
MCHC: 32.5 g/dL (ref 30.0–36.0)
MCV: 92.1 fL (ref 80.0–100.0)
Platelets: 147 10*3/uL — ABNORMAL LOW (ref 150–400)
RBC: 3.91 MIL/uL (ref 3.87–5.11)
RDW: 12.8 % (ref 11.5–15.5)
WBC: 5.9 10*3/uL (ref 4.0–10.5)
nRBC: 0 % (ref 0.0–0.2)

## 2019-08-30 LAB — SARS CORONAVIRUS 2 (TAT 6-24 HRS): SARS Coronavirus 2: NEGATIVE

## 2019-08-30 MED ORDER — VITAMIN E 45 MG (100 UNIT) PO CAPS
100.0000 [IU] | ORAL_CAPSULE | Freq: Every day | ORAL | Status: DC
  Administered 2019-08-30 – 2019-09-01 (×2): 100 [IU] via ORAL
  Filled 2019-08-30 (×3): qty 1

## 2019-08-30 MED ORDER — LISINOPRIL 20 MG PO TABS
40.0000 mg | ORAL_TABLET | Freq: Every day | ORAL | Status: DC
  Administered 2019-08-30 – 2019-09-01 (×3): 40 mg via ORAL
  Filled 2019-08-30 (×3): qty 2

## 2019-08-30 MED ORDER — PANTOPRAZOLE SODIUM 40 MG PO TBEC
40.0000 mg | DELAYED_RELEASE_TABLET | Freq: Every day | ORAL | Status: DC
  Administered 2019-08-31 – 2019-09-01 (×2): 40 mg via ORAL
  Filled 2019-08-30 (×2): qty 1

## 2019-08-30 MED ORDER — ONDANSETRON HCL 4 MG PO TABS: 4.0000 mg | ORAL_TABLET | Freq: Three times a day (TID) | ORAL | Status: DC | PRN

## 2019-08-30 MED ORDER — ASPIRIN EC 81 MG PO TBEC
81.0000 mg | DELAYED_RELEASE_TABLET | Freq: Every day | ORAL | Status: DC
  Administered 2019-08-30 – 2019-08-31 (×2): 81 mg via ORAL
  Filled 2019-08-30 (×2): qty 1

## 2019-08-30 MED ORDER — CEFAZOLIN SODIUM-DEXTROSE 2-4 GM/100ML-% IV SOLN
2.0000 g | INTRAVENOUS | Status: AC
  Administered 2019-08-31: 2 g via INTRAVENOUS
  Filled 2019-08-30: qty 100

## 2019-08-30 MED ORDER — TETRACAINE HCL 0.5 % OP SOLN
1.0000 [drp] | Freq: Every day | OPHTHALMIC | Status: DC
  Administered 2019-08-30: 1 [drp] via OPHTHALMIC
  Filled 2019-08-30: qty 4

## 2019-08-30 MED ORDER — SODIUM CHLORIDE 0.9% FLUSH
3.0000 mL | Freq: Once | INTRAVENOUS | Status: AC
  Administered 2019-08-30: 3 mL via INTRAVENOUS

## 2019-08-30 MED ORDER — HEPARIN SODIUM (PORCINE) 5000 UNIT/ML IJ SOLN
5000.0000 [IU] | Freq: Three times a day (TID) | INTRAMUSCULAR | Status: DC
  Administered 2019-08-31: 5000 [IU] via SUBCUTANEOUS
  Filled 2019-08-30: qty 1

## 2019-08-30 MED ORDER — HYDRALAZINE HCL 20 MG/ML IJ SOLN
10.0000 mg | Freq: Once | INTRAMUSCULAR | Status: AC
  Administered 2019-08-30: 10 mg via INTRAVENOUS
  Filled 2019-08-30: qty 1

## 2019-08-30 MED ORDER — ONDANSETRON HCL 4 MG/2ML IJ SOLN: 4.0000 mg | Freq: Four times a day (QID) | INTRAMUSCULAR | Status: DC | PRN

## 2019-08-30 MED ORDER — FERROUS GLUCONATE 324 (38 FE) MG PO TABS
324.0000 mg | ORAL_TABLET | Freq: Every day | ORAL | Status: DC
  Administered 2019-08-30 – 2019-09-01 (×2): 324 mg via ORAL
  Filled 2019-08-30 (×3): qty 1

## 2019-08-30 MED ORDER — VITAMIN C 500 MG PO TABS
500.0000 mg | ORAL_TABLET | Freq: Every day | ORAL | Status: DC
  Administered 2019-09-01: 500 mg via ORAL
  Filled 2019-08-30 (×2): qty 1

## 2019-08-30 MED ORDER — SODIUM CHLORIDE 0.9 % IV SOLN
80.0000 mg | INTRAVENOUS | Status: AC
  Administered 2019-08-31: 80 mg
  Filled 2019-08-30: qty 2

## 2019-08-30 MED ORDER — CALCIUM CARBONATE-VITAMIN D 500-200 MG-UNIT PO TABS
1.0000 | ORAL_TABLET | Freq: Every day | ORAL | Status: DC
  Administered 2019-08-30 – 2019-09-01 (×2): 1 via ORAL
  Filled 2019-08-30 (×3): qty 1

## 2019-08-30 MED ORDER — ATORVASTATIN CALCIUM 40 MG PO TABS
40.0000 mg | ORAL_TABLET | Freq: Every day | ORAL | Status: DC
  Administered 2019-08-30 – 2019-09-01 (×3): 40 mg via ORAL
  Filled 2019-08-30 (×3): qty 1

## 2019-08-30 MED ORDER — VITAMIN D 25 MCG (1000 UNIT) PO TABS
1000.0000 [IU] | ORAL_TABLET | Freq: Every day | ORAL | Status: DC
  Administered 2019-08-30 – 2019-09-01 (×2): 1000 [IU] via ORAL
  Filled 2019-08-30 (×4): qty 1

## 2019-08-30 MED ORDER — NITROGLYCERIN 0.4 MG SL SUBL: 0.4000 mg | SUBLINGUAL_TABLET | SUBLINGUAL | Status: DC | PRN

## 2019-08-30 MED ORDER — SODIUM CHLORIDE 0.9 % IV SOLN
INTRAVENOUS | Status: DC
  Administered 2019-08-30 (×2): via INTRAVENOUS

## 2019-08-30 MED ORDER — OMEPRAZOLE 20 MG PO CPDR: 20.0000 mg | DELAYED_RELEASE_CAPSULE | Freq: Every day | ORAL | 1 refills | Status: DC

## 2019-08-30 MED ORDER — ACETAMINOPHEN 325 MG PO TABS
650.0000 mg | ORAL_TABLET | Freq: Four times a day (QID) | ORAL | Status: DC | PRN
  Administered 2019-08-31: 650 mg via ORAL
  Filled 2019-08-30: qty 2

## 2019-08-30 MED ORDER — COENZYME Q10 100 MG PO TABS: 30.0000 mg | ORAL_TABLET | Freq: Every day | ORAL | Status: DC

## 2019-08-30 MED ORDER — DOCUSATE SODIUM 100 MG PO CAPS
100.0000 mg | ORAL_CAPSULE | Freq: Every day | ORAL | Status: DC
  Administered 2019-09-01: 100 mg via ORAL
  Filled 2019-08-30 (×2): qty 1

## 2019-08-30 MED ORDER — HEPARIN SODIUM (PORCINE) 5000 UNIT/ML IJ SOLN: 5000.0000 [IU] | Freq: Three times a day (TID) | INTRAMUSCULAR | Status: DC

## 2019-08-30 NOTE — ED Provider Notes (Signed)
[facility] Provider Note   CSN: [id number] Arrival date & time: 08/30/19  1040     History   Chief Complaint Chief Complaint  Patient presents with  . Hypertension  . Arm Pain    HPI Makayla Atkins is a 85 y.o. female.     HPI Elderly female with multiple medical issues presents with concern for weakness, arm pain. Patient speaks a dialect from [place], requests her son acted as a Optometrist, to which he is amenable. Though the patient complains of arm pain, he notes, and she agrees that the pain has been present for approximately 2 years. New concern is that over the past 2 days the patient has had generalized weakness, soreness, without other new focal pain.  No new fever, vomiting, diarrhea. She is reportedly taking all medication as directed, which includes meds for atrial fibrillation, with blood thinning medication. No recent medication changes, diet changes, activity changes. No sick contacts. No headache, confusion, disorientation, speech difficulty. Past Medical History:  Diagnosis Date  . Glaucoma   . Hypertension     Patient Active Problem List   Diagnosis Date Noted  . Hematuria 08/07/2019  . Arthritis of knee 08/07/2019  . Atrial flutter ([place]) 08/06/2019  . Hyperlipidemia 08/06/2019  . Hypertension 07/30/2019    History reviewed. No pertinent surgical history.   OB History   No obstetric history on file.      Home Medications    Prior to Admission medications   Medication Sig Start Date End Date Taking? Authorizing Provider  acetaminophen (TYLENOL) 325 MG tablet Take 650 mg by mouth every 6 (six) hours as needed for mild pain.    [provider]  aspirin EC 81 MG tablet Take 81 mg by mouth daily.    [provider]  atorvastatin (LIPITOR) 40 MG tablet Take 1 tablet (40 mg total) by mouth daily. 08/06/19 08/05/20  Ina Homes, MD  calcium-vitamin D (OSCAL WITH D) 250-125 MG-UNIT  tablet Take 1 tablet by mouth daily.    [provider]  diclofenac sodium (VOLTAREN) 1 % GEL Apply 2 g topically 4 (four) times daily. 08/06/19   Ina Homes, MD  docusate sodium (COLACE) 100 MG capsule Take 100 mg by mouth daily.    [provider]  lisinopril (ZESTRIL) 40 MG tablet Take 1 tablet (40 mg total) by mouth daily. 08/07/19   Ina Homes, MD  Multiple Vitamin (MULTIVITAMIN WITH MINERALS) TABS tablet Take 1 tablet by mouth daily.    [provider]  Omega-3 Fatty Acids (FISH OIL PO) Take 1 capsule by mouth daily.    [provider]  omeprazole (PRILOSEC) 20 MG capsule Take 1 capsule (20 mg total) by mouth daily. 08/30/19   Maudie Mercury, MD  ondansetron (ZOFRAN) 4 MG tablet Take 1 tablet (4 mg total) by mouth every 8 (eight) hours as needed for nausea or vomiting. 07/22/19   Madalyn Rob, MD  rivaroxaban (XARELTO) 20 MG TABS tablet Take 1 tablet (20 mg total) by mouth daily with supper. 08/06/19   Ina Homes, MD  tetracaine (PONTOCAINE) 0.5 % ophthalmic solution Place 1 drop into both eyes daily.    [provider]    Family History No family history on file.  Social History Social History   Tobacco Use  . Smoking status: Never Smoker  . Smokeless tobacco: Never Used  Substance Use Topics  . Alcohol use: Never    Frequency: Never  . Drug use: Never  Allergies   Patient has no known allergies.   Review of Systems Review of Systems  Constitutional:       Per HPI, otherwise negative  HENT:       Per HPI, otherwise negative  Respiratory:       Per HPI, otherwise negative  Cardiovascular:       Per HPI, otherwise negative  Gastrointestinal: Negative for vomiting.  Endocrine:       Negative aside from HPI  Genitourinary:       Neg aside from HPI   Musculoskeletal:       Per HPI, otherwise negative  Skin: Negative.   Neurological: Negative for syncope.   (Review of systems somewhat limited secondary  to the patient's language difficulty, but results provided by her son.) Physical Exam Updated Vital Signs BP (!) 163/78   Pulse (!) 35   Temp 97.9 F (36.6 C) (Oral)   Resp (!) 25   Ht 5\' 3"  (1.6 m)   Wt 79.4 kg   SpO2 94%   BMI 31.00 kg/m   Physical Exam Vitals signs and nursing note reviewed.  Constitutional:      General: She is not in acute distress.    Appearance: She is well-developed.     Comments: Generally unwell adult female in no distress, withdrawn, but interacting via her son as interpreter.  HENT:     Head: Normocephalic and atraumatic.  Eyes:     Conjunctiva/sclera: Conjunctivae normal.  Cardiovascular:     Rate and Rhythm: Normal rate and regular rhythm.  Pulmonary:     Effort: Pulmonary effort is normal. No respiratory distress.     Breath sounds: Normal breath sounds. No stridor.  Abdominal:     General: There is no distension.  Skin:    General: Skin is warm and dry.  Neurological:     General: No focal deficit present.     Mental Status: She is alert.     Cranial Nerves: No cranial nerve deficit.     Motor: Atrophy present. No tremor.     Coordination: Coordination normal.      ED Treatments / Results  Labs (all labs ordered are listed, but only abnormal results are displayed) Labs Reviewed  BASIC METABOLIC PANEL - Abnormal; Notable for the following components:      Result Value   Glucose, Bld 111 (*)    Creatinine, Ser 1.07 (*)    GFR calc non Af Amer 47 (*)    GFR calc Af Amer 54 (*)    All other components within normal limits  CBC - Abnormal; Notable for the following components:   Hemoglobin 11.7 (*)    Platelets 147 (*)    All other components within normal limits  TROPONIN I (HIGH SENSITIVITY) - Abnormal; Notable for the following components:   Troponin I (High Sensitivity) 77 (*)    All other components within normal limits  TROPONIN I (HIGH SENSITIVITY) - Abnormal; Notable for the following components:   Troponin I (High  Sensitivity) 209 (*)    All other components within normal limits  SARS CORONAVIRUS 2 (TAT 6-24 HRS)  SURGICAL PCR SCREEN    EKG EKG Interpretation  Date/Time:  Friday August 30 2019 11:24:53 EST Ventricular Rate:  41 PR Interval:  212 QRS Duration: 110 QT Interval:  660 QTC Calculation: 544 R Axis:   88 Text Interpretation: regular narrow complex qrs ST-t wave abnormality Artifact Abnormal ECG  - poor study Confirmed by Gerhard MunchLockwood, Kelii Chittum [phone]([id number])  on 08/30/2019 11:45:44 AM   Radiology Dg Chest Port 1 View  Result Date: 08/30/2019 CLINICAL DATA:  Chest pain EXAM: PORTABLE CHEST 1 VIEW COMPARISON:  September 2020 FINDINGS: Mild bibasilar atelectasis. Possible trace right pleural effusion. No pneumothorax. Top normal heart size. Mild calcified plaque along the thoracic aorta. IMPRESSION: Mild bibasilar atelectasis and possible trace right effusion. Electronically Signed   By: Guadlupe Spanish M.D.   On: 08/30/2019 13:57    Procedures Procedures (including critical care time)  Medications Ordered in ED Medications  0.9 %  sodium chloride infusion ( Intravenous New Bag/Given 08/30/19 1504)  gentamicin (GARAMYCIN) 80 mg in sodium chloride 0.9 % 500 mL irrigation (has no administration in time range)  ceFAZolin (ANCEF) IVPB 2g/100 mL premix (has no administration in time range)  sodium chloride flush (NS) 0.9 % injection 3 mL (3 mLs Intravenous Given 08/30/19 1501)     Initial Impression / Assessment and Plan / ED Course  I have reviewed the triage vital signs and the nursing notes.  Pertinent labs & imaging results that were available during my care of the patient were reviewed by me and considered in my medical decision making (see chart for details).    Chart review notable for absence of beta-blocker or calcium channel blocker agents.   Initial troponin 77, otherwise initial labs are generally reassuring.  On repeat exam the patient is in similar condition. With concern for new  heart block I discussed her case with our cardiology colleagues for evaluation.  Update:, Patient has repeat troponin elevated compared to initial, 209, preparations are being made for her to proceed to pacemaker placement given concern for complete heart block, junctional escape rhythm of 40. Other initial labs generally reassuring, patient remains awake and alert, bradycardia is persistent.  This elderly female presents with her son who assists as a Nurse, learning disability. Patient has a history of A. fib, is anticoagulated, but is taking no nodal blocking agents.  Patient found to have substantial bradycardia, consistent with junctional escape rhythm, concern for complete heart block, possibly a flutter with 4 1 conduction. Given these findings, concern for symptomatic bradycardia, after conversation with cardiology, patient is preparing for pacemaker placement.  Final Clinical Impressions(s) / ED Diagnoses   Final diagnoses:  Complete heart block (HCC)   CRITICAL CARE Performed by: Gerhard Munch Total critical care time: 40 minutes Critical care time was exclusive of separately billable procedures and treating other patients. Critical care was necessary to treat or prevent imminent or life-threatening deterioration. Critical care was time spent personally by me on the following activities: development of treatment plan with patient and/or surrogate as well as nursing, discussions with consultants, evaluation of patient's response to treatment, examination of patient, obtaining history from patient or surrogate, ordering and performing treatments and interventions, ordering and review of laboratory studies, ordering and review of radiographic studies, pulse oximetry and re-evaluation of patient's condition.    Gerhard Munch, MD 08/30/19 [phone]

## 2019-08-30 NOTE — H&P (Signed)
Makayla Atkins has presented today for surgery, with the diagnosis of high grade AV block.  The various methods of treatment have been discussed with the patient and family. After consideration of risks, benefits and other options for treatment, the patient has consented to  Procedure(s): Pacemaker implant as a surgical intervention .  Risks include but not limited to bleeding, tamponade, infection, pneumothorax, among others. The patient's history has been reviewed, patient examined, no change in status, stable for surgery.  I have reviewed the patient's chart and labs.  Questions were answered to the patient's satisfaction.    Makayla Market Curt Bears, MD 08/30/2019 2:59 PM

## 2019-08-30 NOTE — Progress Notes (Signed)
  Echocardiogram 2D Echocardiogram limited has been performed. Limited echo for EF verbal order from PA.  Makayla Atkins M 08/30/2019, 2:17 PM

## 2019-08-30 NOTE — ED Notes (Signed)
Date and time results received: 08/30/19 1443 (use smartphrase ".now" to insert current time)  Test: trop Critical Value:209  Name of Provider Notified: lockwood  Orders Received? Or Actions Taken?: .

## 2019-08-30 NOTE — ED Notes (Signed)
ED TO INPATIENT HANDOFF REPORT  ED Nurse Name and Phone #:  [phone]  S Name/Age/Gender Makayla Atkins 85 y.o. female Room/Bed: 038C/038C  Code Status   Code Status: Full Code  Home/SNF/Other Home Patient oriented to: self, place, time and situation Is this baseline? Yes   Triage Complete: Triage complete  Chief Complaint high bp  Triage Note PT states she did not feel well today so she took her bp and it was elevated.  168/61.  Upon questioning, she has R arm pain and chest pain - though undetermined for how long.   Allergies No Known Allergies  Level of Care/Admitting Diagnosis ED Disposition    ED Disposition Condition Comment   Admit  Hospital Area: [facility] [[id number]]  Level of Care: Telemetry Cardiac [103]  Covid Evaluation: Confirmed COVID Negative  Diagnosis: Complete heart block [facility]) [[id number]]  Admitting Physician: Regan Lemming [id number]  Attending Physician: Regan Lemming [phone]  PT Class (Do Not Modify): Observation [104]  PT Acc Code (Do Not Modify): Observation [10022]       B Medical/Surgery History Past Medical History:  Diagnosis Date  . Glaucoma   . Hypertension    History reviewed. No pertinent surgical history.   A IV Location/Drains/Wounds Patient Lines/Drains/Airways Status   Active Line/Drains/Airways    Name:   Placement date:   Placement time:   Site:   Days:   Peripheral IV 08/30/19 Right Antecubital   08/30/19    1359    Antecubital   less than 1   Peripheral IV 08/30/19 Left Antecubital   08/30/19    1443    Antecubital   less than 1          Intake/Output Last 24 hours No intake or output data in the 24 hours ending 08/30/19 2250  Labs/Imaging Results for orders placed or performed during the hospital encounter of 08/30/19 (from the past 48 hour(s))  Basic metabolic panel     Status: Abnormal   Collection Time: 08/30/19 11:30 AM  Result Value Ref Range   Sodium 141 135 - 145 mmol/L    Potassium 3.5 3.5 - 5.1 mmol/L   Chloride 103 98 - 111 mmol/L   CO2 24 22 - 32 mmol/L   Glucose, Bld 111 (H) 70 - 99 mg/dL   BUN 18 8 - 23 mg/dL   Creatinine, Ser 2.84 (H) 0.44 - 1.00 mg/dL   Calcium 9.1 8.9 - 13.2 mg/dL   GFR calc non Af Amer 47 (L) >60 mL/min   GFR calc Af Amer 54 (L) >60 mL/min   Anion gap 14 5 - 15    Comment: Performed at [facility], [address], [place], [place] [address]  CBC     Status: Abnormal   Collection Time: 08/30/19 11:30 AM  Result Value Ref Range   WBC 5.9 4.0 - 10.5 K/uL   RBC 3.91 3.87 - 5.11 MIL/uL   Hemoglobin 11.7 (L) 12.0 - 15.0 g/dL   HCT 27.2 53.6 - 64.4 %   MCV 92.1 80.0 - 100.0 fL   MCH 29.9 26.0 - 34.0 pg   MCHC 32.5 30.0 - 36.0 g/dL   RDW 03.4 74.2 - 59.5 %   Platelets 147 (L) 150 - 400 K/uL   nRBC 0.0 0.0 - 0.2 %    Comment: Performed at [facility], [address] [place], [place] [address]  Troponin I (High Sensitivity)     Status: Abnormal  Collection Time: 08/30/19 11:30 AM  Result Value Ref Range   Troponin I (High Sensitivity) 77 (H) <18 ng/L    Comment: (NOTE) Elevated high sensitivity troponin I (hsTnI) values and significant  changes across serial measurements may suggest ACS but many other  chronic and acute conditions are known to elevate hsTnI results.  Refer to the "Links" section for chest pain algorithms and additional  guidance. Performed at [facility], [place] [address], [place], [place] [address]   SARS CORONAVIRUS 2 (TAT 6-24 HRS) Nasopharyngeal Nasopharyngeal Swab     Status: None   Collection Time: 08/30/19 12:33 PM   Specimen: Nasopharyngeal Swab  Result Value Ref Range   SARS Coronavirus 2 NEGATIVE NEGATIVE    Comment: (NOTE) SARS-CoV-2 target nucleic acids are NOT DETECTED. The SARS-CoV-2 RNA is generally detectable in upper and lower respiratory specimens during the acute phase of infection. Negative results do not preclude SARS-CoV-2 infection, do not rule  out co-infections with other pathogens, and should not be used as the sole basis for treatment or other patient management decisions. Negative results must be combined with clinical observations, patient history, and epidemiological information. The expected result is Negative. Fact Sheet for Patients: SugarRoll.be Fact Sheet for Healthcare Providers: https://www.woods-mathews.com/ This test is not yet approved or cleared by the [place] FDA and  has been authorized for detection and/or diagnosis of SARS-CoV-2 by FDA under an Emergency Use Authorization (EUA). This EUA will remain  in effect (meaning this test can be used) for the duration of the COVID-19 declaration under Section 56 4(b)(1) of the Act, 21 U.S.C. section 360bbb-3(b)(1), unless the authorization is terminated or revoked sooner. Performed at [facility], [place] [address], [place], [place] [address]   Troponin I (High Sensitivity)     Status: Abnormal   Collection Time: 08/30/19  1:50 PM  Result Value Ref Range   Troponin I (High Sensitivity) 209 (HH) <18 ng/L    Comment: CRITICAL RESULT CALLED TO, READ BACK BY AND VERIFIED WITH: H HARDY RN [phone] [id number] BY A BENNETT (NOTE) Elevated high sensitivity troponin I (hsTnI) values and significant  changes across serial measurements may suggest ACS but many other  chronic and acute conditions are known to elevate hsTnI results.  Refer to the Links section for chest pain algorithms and additional  guidance. Performed at [facility], [place] [address] [place], [place]  [address]   Surgical PCR screen     Status: None   Collection Time: 08/30/19  2:49 PM   Specimen: Nasal Mucosa; Nasal Swab  Result Value Ref Range   MRSA, PCR NEGATIVE NEGATIVE   Staphylococcus aureus NEGATIVE NEGATIVE    Comment: (NOTE) The Xpert SA Assay (FDA approved for NASAL specimens in patients 15 years of age and older), is one component of a  comprehensive surveillance program. It is not intended to diagnose infection nor to guide or monitor treatment. Performed at [facility], [place] [address], [place], [place] [address]    Dg Chest Port 1 View  Result Date: 08/30/2019 CLINICAL DATA:  Chest pain EXAM: PORTABLE CHEST 1 VIEW COMPARISON:  September 2020 FINDINGS: Mild bibasilar atelectasis. Possible trace right pleural effusion. No pneumothorax. Top normal heart size. Mild calcified plaque along the thoracic aorta. IMPRESSION: Mild bibasilar atelectasis and possible trace right effusion. Electronically Signed   By: Macy Mis M.D.   On: 08/30/2019 13:57    Pending Labs [employer] (From admission, onward)    Start  Ordered   08/30/19 1833  CBC  (heparin)  Once,   STAT    Comments: Baseline for heparin therapy IF NOT ALREADY DRAWN.  Notify MD if PLT < 100 K.    08/30/19 1832   08/30/19 1833  Creatinine, serum  (heparin)  Once,   STAT    Comments: Baseline for heparin therapy IF NOT ALREADY DRAWN.    08/30/19 1832          Vitals/Pain Today's Vitals   08/30/19 2015 08/30/19 2030 08/30/19 2041 08/30/19 2130  BP: (!) 199/93 (!) 198/81 (!) 198/81 (!) 161/93  Pulse: (!) 34 (!) 34  (!) 42  Resp: (!) 24 16  (!) 25  Temp:      TempSrc:      SpO2: 96% 95%  100%  Weight:      Height:      PainSc:        Isolation Precautions No active isolations  Medications Medications  0.9 %  sodium chloride infusion ( Intravenous New Bag/Given 08/30/19 1504)  gentamicin (GARAMYCIN) 80 mg in sodium chloride 0.9 % 500 mL irrigation (0 mg Irrigation Hold 08/30/19 2050)  ceFAZolin (ANCEF) IVPB 2g/100 mL premix (0 g Intravenous Hold 08/30/19 2048)  aspirin EC tablet 81 mg (81 mg Oral Given 08/30/19 2037)  calcium-vitamin D (OSCAL WITH D) 500-200 MG-UNIT per tablet 1 tablet (1 tablet Oral Given 08/30/19 2036)  tetracaine (PONTOCAINE) 0.5 % ophthalmic solution 1 drop (1 drop Both Eyes Given 08/30/19 1936)  acetaminophen  (TYLENOL) tablet 650 mg (has no administration in time range)  docusate sodium (COLACE) capsule 100 mg (0 mg Oral Hold 08/30/19 2051)  ondansetron (ZOFRAN) tablet 4 mg (has no administration in time range)  atorvastatin (LIPITOR) tablet 40 mg (40 mg Oral Given 08/30/19 1937)  lisinopril (ZESTRIL) tablet 40 mg (40 mg Oral Given 08/30/19 1936)  pantoprazole (PROTONIX) EC tablet 40 mg (has no administration in time range)  vitamin C (ASCORBIC ACID) tablet 500 mg (has no administration in time range)  ferrous gluconate (FERGON) tablet 324 mg (324 mg Oral Given 08/30/19 2036)  cholecalciferol (VITAMIN D) tablet 1,000 Units (1,000 Units Oral Given 08/30/19 2036)  vitamin E capsule 100 Units (100 Units Oral Given 08/30/19 2036)  nitroGLYCERIN (NITROSTAT) SL tablet 0.4 mg (has no administration in time range)  ondansetron (ZOFRAN) injection 4 mg (has no administration in time range)  heparin injection 5,000 Units (has no administration in time range)  sodium chloride flush (NS) 0.9 % injection 3 mL (3 mLs Intravenous Given 08/30/19 1501)  hydrALAZINE (APRESOLINE) injection 10 mg (10 mg Intravenous Given 08/30/19 2041)    Mobility walks with device Moderate fall risk   Focused Assessments Cardiac Assessment Handoff:  Cardiac Rhythm: Sinus bradycardia No results found for: CKTOTAL, CKMB, CKMBINDEX, TROPONINI No results found for: DDIMER Does the Patient currently have chest pain? No      R Recommendations: See Admitting Provider Note  Report given to:   Additional Notes:  Patients children will have to translate. No interpreters available for patients distinct dialect.

## 2019-08-30 NOTE — ED Notes (Signed)
ED TO INPATIENT HANDOFF REPORT  ED Nurse Name and Phone #:   S Name/Age/Gender Makayla Atkins 85 y.o. female Room/Bed: 038C/038C  Code Status   Code Status: Prior  Home/SNF/Other Home Patient oriented to: self, place, time and situation Is this baseline? Yes   Triage Complete: Triage complete  Chief Complaint high bp  Triage Note PT states she did not feel well today so she took her bp and it was elevated.  168/61.  Upon questioning, she has R arm pain and chest pain - though undetermined for how long.   Allergies No Known Allergies  Level of Care/Admitting Diagnosis ED Disposition    ED Disposition Condition Comment   Admit  Hospital Area: [facility] [[id number]]  Level of Care: Telemetry Cardiac [103]  Covid Evaluation: Person Under Investigation (PUI)  Diagnosis: Complete heart block [facility]) [[id number]]  Admitting Physician: Constance Haw [phone]  Attending Physician: Constance Haw [phone]  PT Class (Do Not Modify): Observation [104]  PT Acc Code (Do Not Modify): Observation [10022]       B Medical/Surgery History Past Medical History:  Diagnosis Date  . Glaucoma   . Hypertension    History reviewed. No pertinent surgical history.   A IV Location/Drains/Wounds Patient Lines/Drains/Airways Status   Active Line/Drains/Airways    Name:   Placement date:   Placement time:   Site:   Days:   Peripheral IV 08/30/19 Right Antecubital   08/30/19    1359    Antecubital   less than 1   Peripheral IV 08/30/19 Left Antecubital   08/30/19    1443    Antecubital   less than 1          Intake/Output Last 24 hours No intake or output data in the 24 hours ending 08/30/19 1831  Labs/Imaging Results for orders placed or performed during the hospital encounter of 08/30/19 (from the past 48 hour(s))  Basic metabolic panel     Status: Abnormal   Collection Time: 08/30/19 11:30 AM  Result Value Ref Range   Sodium 141 135 - 145 mmol/L    Potassium 3.5 3.5 - 5.1 mmol/L   Chloride 103 98 - 111 mmol/L   CO2 24 22 - 32 mmol/L   Glucose, Bld 111 (H) 70 - 99 mg/dL   BUN 18 8 - 23 mg/dL   Creatinine, Ser 1.07 (H) 0.44 - 1.00 mg/dL   Calcium 9.1 8.9 - 10.3 mg/dL   GFR calc non Af Amer 47 (L) >60 mL/min   GFR calc Af Amer 54 (L) >60 mL/min   Anion gap 14 5 - 15    Comment: Performed at [facility], [place] [address], [place], [place] [address]  CBC     Status: Abnormal   Collection Time: 08/30/19 11:30 AM  Result Value Ref Range   WBC 5.9 4.0 - 10.5 K/uL   RBC 3.91 3.87 - 5.11 MIL/uL   Hemoglobin 11.7 (L) 12.0 - 15.0 g/dL   HCT 36.0 36.0 - 46.0 %   MCV 92.1 80.0 - 100.0 fL   MCH 29.9 26.0 - 34.0 pg   MCHC 32.5 30.0 - 36.0 g/dL   RDW 12.8 11.5 - 15.5 %   Platelets 147 (L) 150 - 400 K/uL   nRBC 0.0 0.0 - 0.2 %    Comment: Performed at [facility], [place] [address] [place], [place] [address]  Troponin I (High Sensitivity)     Status: Abnormal   Collection  Time: 08/30/19 11:30 AM  Result Value Ref Range   Troponin I (High Sensitivity) 77 (H) <18 ng/L    Comment: (NOTE) Elevated high sensitivity troponin I (hsTnI) values and significant  changes across serial measurements may suggest ACS but many other  chronic and acute conditions are known to elevate hsTnI results.  Refer to the "Links" section for chest pain algorithms and additional  guidance. Performed at [facility], [address] [place], [place] [address]   SARS CORONAVIRUS 2 (TAT 6-24 HRS) Nasopharyngeal Nasopharyngeal Swab     Status: None   Collection Time: 08/30/19 12:33 PM   Specimen: Nasopharyngeal Swab  Result Value Ref Range   SARS Coronavirus 2 NEGATIVE NEGATIVE    Comment: (NOTE) SARS-CoV-2 target nucleic acids are NOT DETECTED. The SARS-CoV-2 RNA is generally detectable in upper and lower respiratory specimens during the acute phase of infection. Negative results do not preclude SARS-CoV-2 infection, do not rule  out co-infections with other pathogens, and should not be used as the sole basis for treatment or other patient management decisions. Negative results must be combined with clinical observations, patient history, and epidemiological information. The expected result is Negative. Fact Sheet for Patients: HairSlick.no Fact Sheet for Healthcare Providers: quierodirigir.com This test is not yet approved or cleared by the [place] FDA and  has been authorized for detection and/or diagnosis of SARS-CoV-2 by FDA under an Emergency Use Authorization (EUA). This EUA will remain  in effect (meaning this test can be used) for the duration of the COVID-19 declaration under Section 56 4(b)(1) of the Act, 21 U.S.C. section 360bbb-3(b)(1), unless the authorization is terminated or revoked sooner. Performed at [facility], [address], [place], [place] [address]   Troponin I (High Sensitivity)     Status: Abnormal   Collection Time: 08/30/19  1:50 PM  Result Value Ref Range   Troponin I (High Sensitivity) 209 (HH) <18 ng/L    Comment: CRITICAL RESULT CALLED TO, READ BACK BY AND VERIFIED WITH: H Gracelynne Benedict RN [phone] BY A BENNETT (NOTE) Elevated high sensitivity troponin I (hsTnI) values and significant  changes across serial measurements may suggest ACS but many other  chronic and acute conditions are known to elevate hsTnI results.  Refer to the Links section for chest pain algorithms and additional  guidance. Performed at [facility], [address], [place], [place] [address]   Surgical PCR screen     Status: None   Collection Time: 08/30/19  2:49 PM   Specimen: Nasal Mucosa; Nasal Swab  Result Value Ref Range   MRSA, PCR NEGATIVE NEGATIVE   Staphylococcus aureus NEGATIVE NEGATIVE    Comment: (NOTE) The Xpert SA Assay (FDA approved for NASAL specimens in patients 35 years of age and older), is one component of a  comprehensive surveillance program. It is not intended to diagnose infection nor to guide or monitor treatment. Performed at [facility], [address], [place], [place] [address]    Dg Chest Port 1 View  Result Date: 08/30/2019 CLINICAL DATA:  Chest pain EXAM: PORTABLE CHEST 1 VIEW COMPARISON:  September 2020 FINDINGS: Mild bibasilar atelectasis. Possible trace right pleural effusion. No pneumothorax. Top normal heart size. Mild calcified plaque along the thoracic aorta. IMPRESSION: Mild bibasilar atelectasis and possible trace right effusion. Electronically Signed   By: Guadlupe Spanish M.D.   On: 08/30/2019 13:57    Pending Labs [employer] (From admission, onward)    Start  Ordered   Signed and Held  CBC  (heparin)  Once,   R    Comments: Baseline for heparin therapy IF NOT ALREADY DRAWN.  Notify MD if PLT < 100 K.    Signed and Held   Signed and Held  Creatinine, serum  (heparin)  Once,   R    Comments: Baseline for heparin therapy IF NOT ALREADY DRAWN.    Signed and Held          Vitals/Pain Today's Vitals   08/30/19 1700 08/30/19 1715 08/30/19 1730 08/30/19 1815  BP:  (!) 200/74 (!) 194/78 (!) 185/71  Pulse: (!) 34 (!) 33 (!) 34   Resp: (!) 23 20 (!) 24 (!) 24  Temp:      TempSrc:      SpO2: 96% 96% 95%   Weight:      Height:      PainSc:        Isolation Precautions No active isolations  Medications Medications  0.9 %  sodium chloride infusion ( Intravenous New Bag/Given 08/30/19 1504)  gentamicin (GARAMYCIN) 80 mg in sodium chloride 0.9 % 500 mL irrigation (has no administration in time range)  ceFAZolin (ANCEF) IVPB 2g/100 mL premix (has no administration in time range)  sodium chloride flush (NS) 0.9 % injection 3 mL (3 mLs Intravenous Given 08/30/19 1501)    Mobility walks with device Moderate fall risk   Focused Assessments Cardiac Assessment Handoff:  Cardiac Rhythm: Sinus bradycardia No results found for: CKTOTAL, CKMB, CKMBINDEX,  TROPONINI No results found for: DDIMER Does the Patient currently have chest pain? No      R Recommendations: See Admitting Provider Note  Report given to:   Additional Notes: .

## 2019-08-30 NOTE — Progress Notes (Signed)
   Pt primary language is "Pinyin" a dialect of [place].   Discussed with in-house interpreter services. No interpreter available.  Discussed with virtual interpreting services. They have no interpreter available.   They offered three "Close" alternatives of Ngambay, Bantu, Duala. Pt unfortunately doesn't speak or understand these language.   Discussed with family who are willing to interpret via phone.  Daughter, Asa Saunas will be primary.   Primary Phone# [phone] Secondary Phone# [phone]  Son, Adela Lank will be back up. His phone# is [phone]. He is in the building, but does not have a phone charge with him.   Legrand Como [address]" [place], PA-C  08/30/2019 3:41 PM

## 2019-08-30 NOTE — ED Triage Notes (Signed)
PT states she did not feel well today so she took her bp and it was elevated.  168/61.  Upon questioning, she has R arm pain and chest pain - though undetermined for how long.

## 2019-08-30 NOTE — Consult Note (Addendum)
ELECTROPHYSIOLOGY CONSULT NOTE    Patient ID: Makayla Atkins MRN: [id number], DOB/AGE: [birth year] 85 y.o.  Admit date: 08/30/2019 Date of Consult: 08/30/2019  Primary Physician: Dolan Amen, MD Primary Cardiologist: No primary care provider on file.  Electrophysiologist: New to Dr. Elberta Fortis   Referring Provider: Dr. Jeraldine Loots  Patient Profile: Makayla Atkins is a 85 y.o. female with a history of HTN, HLD, and arthritis who is being seen today for the evaluation of symptomatic bradycardia at the request of Dr. Jeraldine Loots.  Pt was seen by PCP 08/06/2019 and noted to be in atrial flutter with slow RVR (4:1 block). At that time, she was started on Xarelto for atrial flutter with CHA2DS2VASC of at least 4    HPI:  Pt speaks dialect from [place], and her son acts as interpreter Makayla Atkins is a 85 y.o. female with medical history above. She presented to [facility] with symptoms of weakness. She also complained of "arm pain" but this is a chronic issue for at least "2 years". She denies fever, vomiting, diarrhea, or new medications.  Pertinent labs on admission include HS-Trop 77, Cr 1.07, WBC 5.9, Hgb 11.7 (which is stable). COVID test pending.   She reports, through her son, weakness for the past few days, and episodes of near syncope. Denies chest pain. She does not have history of exertional chest pain. No history of heart catheterization that either of them are aware of.   Past Medical History:  Diagnosis Date  . Glaucoma   . Hypertension     Surgical History: History reviewed. No pertinent surgical history.   Inpatient Medications:  . sodium chloride flush  3 mL Intravenous Once   Allergies: No Known Allergies  Social History   Socioeconomic History  . Marital status: Widowed    Spouse name: Not on file  . Number of children: Not on file  . Years of education: Not on file  . Highest education level: Not on file  Occupational History  . Not on file  Social Needs  .  Financial resource strain: Not on file  . Food insecurity    Worry: Not on file    Inability: Not on file  . Transportation needs    Medical: Not on file    Non-medical: Not on file  Tobacco Use  . Smoking status: Never Smoker  . Smokeless tobacco: Never Used  Substance and Sexual Activity  . Alcohol use: Never    Frequency: Never  . Drug use: Never  . Sexual activity: Not on file  Lifestyle  . Physical activity    Days per week: Not on file    Minutes per session: Not on file  . Stress: Not on file  Relationships  . Social Musician on phone: Not on file    Gets together: Not on file    Attends religious service: Not on file    Active member of club or organization: Not on file    Attends meetings of clubs or organizations: Not on file    Relationship status: Not on file  . Intimate partner violence    Fear of current or ex partner: Not on file    Emotionally abused: Not on file    Physically abused: Not on file    Forced sexual activity: Not on file  Other Topics Concern  . Not on file  Social History Narrative  . Not on file     No family history of sudden cardiac death.  Review of Systems: All other systems reviewed and are otherwise negative except as noted above.  Physical Exam: Vitals:   08/30/19 1215 08/30/19 1230 08/30/19 1245 08/30/19 1300  BP: (!) 163/78 (!) 182/72 (!) 175/78 (!) 149/68  Pulse: (!) 35 (!) 35 (!) 34 (!) 34  Resp: (!) 25 16 (!) 23 (!) 25  Temp:      TempSrc:      SpO2: 94% 95% 94% 95%  Weight:      Height:        GEN- The patient is elderly appearing, alert and oriented x 3 today.   HEENT: normocephalic, atraumatic; sclera clear, conjunctiva pink; hearing intact; oropharynx clear; neck supple Lungs- Clear to ausculation bilaterally, normal work of breathing.  No wheezes, rales, rhonchi Heart- Irregular and bradycardic. No murmurs, rubs or gallops GI- soft, non-tender, non-distended, bowel sounds present Extremities-  no clubbing, cyanosis, or edema; DP/PT/radial pulses 2+ bilaterally MS- no significant deformity or atrophy Skin- warm and dry, no rash or lesion Psych- euthymic mood, full affect Neuro- strength and sensation are intact  Labs:   Lab Results  Component Value Date   WBC 5.9 08/30/2019   HGB 11.7 (L) 08/30/2019   HCT 36.0 08/30/2019   MCV 92.1 08/30/2019   PLT 147 (L) 08/30/2019    Recent Labs  Lab 08/30/19 1130  NA 141  K 3.5  CL 103  CO2 24  BUN 18  CREATININE 1.07*  CALCIUM 9.1  GLUCOSE 111*    Radiology/Studies: Dg Chest Port 1 View  Result Date: 08/30/2019 CLINICAL DATA:  Chest pain EXAM: PORTABLE CHEST 1 VIEW COMPARISON:  September 2020 FINDINGS: Mild bibasilar atelectasis. Possible trace right pleural effusion. No pneumothorax. Top normal heart size. Mild calcified plaque along the thoracic aorta. IMPRESSION: Mild bibasilar atelectasis and possible trace right effusion. Electronically Signed   By: Macy Mis M.D.   On: 08/30/2019 13:57   EKG: Atrial flutter and advanced heart block with rate of 41 bpm (personally reviewed)  TELEMETRY: Atrial flutter with 4:1 block in the 30s currently (personally reviewed)  Assessment/Plan: 1.  Advanced heart block - Pt has advanced heart block without reversible cause identified.  - Risk and benefits of pacemaker implantation discussed with patient and son at length. They wish to speak with her daughter prior to making any decision.  - Limited Echo showed normal LV function.  - COVID test pending.   2. Atrial Flutter - No AV nodal blockade on board. She has been on Xarelto since 08/06/2019, or just over three weeks for CHA2DS2VASC of at least 4   3. HTN - Systolic running in 144-315Q. Makayla Atkins consider additional meds once PPM placed.   Makayla Atkins discuss plan further with MD.   ADDENDUM: Discussed further with daughter, son, and patient along with Dr. Curt Bears. Pt consents to procedure, and Makayla Atkins plan to proceed this afternoon.    For questions or updates, please contact Makayla Atkins Please consult [email or web address] for contact info under Cardiology/STEMI.  Signed, Makayla Friar, PA-C  08/30/2019 1:31 PM   I have seen and examined this patient with Makayla Atkins.  Agree with above, note added to reflect my findings.  On exam, bradycardic, no murmurs, lungs clear.  Presented to the hospital with weakness and fatigue.  Found to be in what appears to be typical atrial flutter and either complete heart block or high-grade second-degree AV block.  On no rate controlling medications.  We Makayla Atkins thus plan for pacemaker implant.  Makayla Atkins M. Kendrick Haapala MD  08/30/2019 2:57 PM

## 2019-08-30 NOTE — ED Notes (Signed)
Surgical consent for pacemaker is filled out at bedside. MD will sign consent when pt gets to preop.

## 2019-08-31 ENCOUNTER — Encounter (HOSPITAL_COMMUNITY)
Admission: EM | Disposition: A | Discharge status: Home / Self Care | Payer: Self-pay | Source: Home / Self Care | Attending: Cardiology

## 2019-08-31 HISTORY — PX: PACEMAKER IMPLANT: EP1218

## 2019-08-31 SURGERY — PACEMAKER IMPLANT

## 2019-08-31 MED ORDER — ONDANSETRON HCL 4 MG/2ML IJ SOLN: 4.0000 mg | Freq: Four times a day (QID) | INTRAMUSCULAR | Status: DC | PRN

## 2019-08-31 MED ORDER — IOHEXOL 350 MG/ML SOLN
INTRAVENOUS | Status: DC | PRN
  Administered 2019-08-31: 50 mL

## 2019-08-31 MED ORDER — MIDAZOLAM HCL 5 MG/5ML IJ SOLN
INTRAMUSCULAR | Status: AC
  Filled 2019-08-31: qty 5

## 2019-08-31 MED ORDER — HEPARIN (PORCINE) IN NACL 1000-0.9 UT/500ML-% IV SOLN
INTRAVENOUS | Status: AC
  Filled 2019-08-31: qty 500

## 2019-08-31 MED ORDER — LIDOCAINE HCL (PF) 1 % IJ SOLN
INTRAMUSCULAR | Status: AC
  Filled 2019-08-31: qty 30

## 2019-08-31 MED ORDER — MIDAZOLAM HCL 5 MG/5ML IJ SOLN
INTRAMUSCULAR | Status: DC | PRN
  Administered 2019-08-31: 1 mg via INTRAVENOUS

## 2019-08-31 MED ORDER — FENTANYL CITRATE (PF) 100 MCG/2ML IJ SOLN
INTRAMUSCULAR | Status: AC
  Filled 2019-08-31: qty 2

## 2019-08-31 MED ORDER — LIDOCAINE HCL (PF) 1 % IJ SOLN
INTRAMUSCULAR | Status: DC | PRN
  Administered 2019-08-31: 60 mL

## 2019-08-31 MED ORDER — SODIUM CHLORIDE 0.9% FLUSH: 3.0000 mL | INTRAVENOUS | Status: DC | PRN

## 2019-08-31 MED ORDER — CEFAZOLIN SODIUM-DEXTROSE 2-4 GM/100ML-% IV SOLN
INTRAVENOUS | Status: AC
  Filled 2019-08-31: qty 100

## 2019-08-31 MED ORDER — AMLODIPINE BESYLATE 5 MG PO TABS
5.0000 mg | ORAL_TABLET | Freq: Every day | ORAL | Status: DC
  Administered 2019-08-31 – 2019-09-01 (×2): 5 mg via ORAL
  Filled 2019-08-31 (×2): qty 1

## 2019-08-31 MED ORDER — LIDOCAINE HCL 1 % IJ SOLN
INTRAMUSCULAR | Status: AC
  Filled 2019-08-31: qty 20

## 2019-08-31 MED ORDER — HYDROCODONE-ACETAMINOPHEN 5-325 MG PO TABS
1.0000 | ORAL_TABLET | ORAL | Status: DC | PRN
  Administered 2019-09-01: 1 via ORAL
  Filled 2019-08-31: qty 1

## 2019-08-31 MED ORDER — CEFAZOLIN SODIUM-DEXTROSE 1-4 GM/50ML-% IV SOLN
1.0000 g | Freq: Four times a day (QID) | INTRAVENOUS | Status: AC
  Administered 2019-08-31 – 2019-09-01 (×3): 1 g via INTRAVENOUS
  Filled 2019-08-31 (×3): qty 50

## 2019-08-31 MED ORDER — SODIUM CHLORIDE 0.9 % IV SOLN
INTRAVENOUS | Status: AC
  Filled 2019-08-31: qty 2

## 2019-08-31 MED ORDER — SODIUM CHLORIDE 0.9% FLUSH
3.0000 mL | Freq: Two times a day (BID) | INTRAVENOUS | Status: DC
  Administered 2019-08-31: 3 mL via INTRAVENOUS

## 2019-08-31 MED ORDER — ACETAMINOPHEN 325 MG PO TABS
325.0000 mg | ORAL_TABLET | ORAL | Status: DC | PRN
  Administered 2019-09-01: 650 mg via ORAL
  Filled 2019-08-31: qty 2

## 2019-08-31 MED ORDER — FENTANYL CITRATE (PF) 100 MCG/2ML IJ SOLN
INTRAMUSCULAR | Status: DC | PRN
  Administered 2019-08-31: 25 ug via INTRAVENOUS

## 2019-08-31 MED ORDER — HEPARIN (PORCINE) IN NACL 1000-0.9 UT/500ML-% IV SOLN
INTRAVENOUS | Status: DC | PRN
  Administered 2019-08-31: 500 mL

## 2019-08-31 MED ORDER — TRAMADOL HCL 50 MG PO TABS
50.0000 mg | ORAL_TABLET | Freq: Once | ORAL | Status: AC | PRN
  Administered 2019-08-31: 50 mg via ORAL
  Filled 2019-08-31: qty 1

## 2019-08-31 MED ORDER — SODIUM CHLORIDE 0.9 % IV SOLN: 250.0000 mL | INTRAVENOUS | Status: DC | PRN

## 2019-08-31 SURGICAL SUPPLY — 7 items
CABLE SURGICAL S-101-97-12 (CABLE) ×2 IMPLANT
LEAD TENDRIL MRI 52CM LPA1200M (Lead) ×2 IMPLANT
LEAD TENDRIL MRI 58CM LPA1200M (Lead) ×2 IMPLANT
PACEMAKER ASSURITY DR-RF (Pacemaker) ×2 IMPLANT
PAD PRO RADIOLUCENT 2001M-C (PAD) ×2 IMPLANT
SHEATH 8FR PRELUDE SNAP 13 (SHEATH) ×4 IMPLANT
TRAY PACEMAKER INSERTION (PACKS) ×2 IMPLANT

## 2019-08-31 NOTE — Progress Notes (Signed)
Orthopedic Tech Progress Note Patient Details:  Makayla Atkins 01-Aug-1930 [id number] RN said patient has on sling Patient ID: Makayla Atkins, female   DOB: 12-09-1929, 85 y.o.   MRN: [id number]   Makayla Atkins 08/31/2019, 4:42 PM

## 2019-08-31 NOTE — Progress Notes (Signed)
Pt blood pressure 170s/80s. MD on call notified. Will continue to monitor.

## 2019-08-31 NOTE — H&P (View-Only) (Signed)
Progress Note   Subjective   Resting comforably, the patient denies CP or SOB (per son who interprets for her).  No new concerns  Inpatient Medications    Scheduled Meds: . aspirin EC  81 mg Oral Daily  . atorvastatin  40 mg Oral Daily  . calcium-vitamin D  1 tablet Oral Daily  . cholecalciferol  1,000 Units Oral Daily  . docusate sodium  100 mg Oral Daily  . ferrous gluconate  324 mg Oral Daily  . gentamicin irrigation  80 mg Irrigation To Cath  . heparin  5,000 Units Subcutaneous Q8H  . lisinopril  40 mg Oral Daily  . pantoprazole  40 mg Oral Daily  . tetracaine  1 drop Both Eyes Daily  . vitamin C  500 mg Oral Daily  . vitamin E  100 Units Oral Daily   Continuous Infusions: . sodium chloride 100 mL/hr at 08/31/19 0000  .  ceFAZolin (ANCEF) IV Stopped (08/30/19 2048)   PRN Meds: acetaminophen, nitroGLYCERIN, ondansetron (ZOFRAN) IV, ondansetron   Vital Signs    Vitals:   08/31/19 0244 08/31/19 0401 08/31/19 0643 08/31/19 0656  BP:  (!) 203/92 (!) 172/86 (!) 174/75  Pulse: (!) 42 (!) 55 (!) 41 (!) 36  Resp:  20 (!) 21 (!) 21  Temp:  (!) 97.4 F (36.3 C)  98.4 F (36.9 C)  TempSrc:  Oral  Oral  SpO2: 97% 97% 100% 97%  Weight:      Height:        Intake/Output Summary (Last 24 hours) at 08/31/2019 7035 Last data filed at 08/31/2019 0000 Gross per 24 hour  Intake 891.76 ml  Output -  Net 891.76 ml   Filed Weights   08/30/19 1114 08/31/19 0001  Weight: 79.4 kg 73.6 kg    Telemetry    Atrial flutter,  V rates 40s - Personally Reviewed  Physical Exam   GEN- The patient is elderly appearing, comfortable,  Sleeping but rouses Head- normocephalic, atraumatic Eyes-  Sclera clear, conjunctiva pink Ears- hearing intact Oropharynx- clear Neck- supple, Lungs-  normal work of breathing Heart- bradycardic rhythm  Extremities- no clubbing, cyanosis, or edema  MS- age appropriate atrophy   Labs    Chemistry Recent Labs  Lab 08/30/19 1130  NA 141  K  3.5  CL 103  CO2 24  GLUCOSE 111*  BUN 18  CREATININE 1.07*  CALCIUM 9.1  GFRNONAA 47*  GFRAA 54*  ANIONGAP 14     Hematology Recent Labs  Lab 08/30/19 1130  WBC 5.9  RBC 3.91  HGB 11.7*  HCT 36.0  MCV 92.1  MCH 29.9  MCHC 32.5  RDW 12.8  PLT 147*       Assessment & Plan    1.  Second degree AV block The patient has symptomatic second degree heart block with symptomatic bradycardia.  No reversible causes are identified.  I would therefore recommend pacemaker implantation at this time.  Risks, benefits, alternatives to pacemaker implantation were discussed in detail with the patient (via her son who translates) today. They understand that the risks include but are not limited to bleeding, infection, pneumothorax, perforation, tamponade, vascular damage, renal failure, MI, stroke, death,  and lead dislodgement and wishes to proceed. We will therefore schedule the procedure at the next available time.  She is tentatively scheduled for 12:30 today.  2. Atypical atrial flutter Hold xarelto for pacemaker Resume Emison when able  3. Hypertensive cardiovascular disease LVH noted on echo,  EF  preserved Will adjust medicines after pacemaker is in place.  Would follow conservatively until then.   Hillis Range MD, [facility] 08/31/2019 8:37 AM

## 2019-08-31 NOTE — Plan of Care (Signed)

## 2019-08-31 NOTE — Interval H&P Note (Signed)
History and Physical Interval Note:  08/31/2019 11:15 AM  Makayla Atkins  has presented today for surgery, with the diagnosis of Heart Block.  The various methods of treatment have been discussed with the patient and family. After consideration of risks, benefits and other options for treatment, the patient has consented to  Procedure(s): PACEMAKER IMPLANT (N/A) as a surgical intervention.  The patient's history has been reviewed, patient examined, no change in status, stable for surgery.  I have reviewed the patient's chart and labs.  Questions were answered to the patient's satisfaction.     Makayla Atkins

## 2019-08-31 NOTE — Progress Notes (Signed)
Progress Note   Subjective   Resting comforably, the patient denies CP or SOB (per son who interprets for her).  No new concerns  Inpatient Medications    Scheduled Meds: . aspirin EC  81 mg Oral Daily  . atorvastatin  40 mg Oral Daily  . calcium-vitamin D  1 tablet Oral Daily  . cholecalciferol  1,000 Units Oral Daily  . docusate sodium  100 mg Oral Daily  . ferrous gluconate  324 mg Oral Daily  . gentamicin irrigation  80 mg Irrigation To Cath  . heparin  5,000 Units Subcutaneous Q8H  . lisinopril  40 mg Oral Daily  . pantoprazole  40 mg Oral Daily  . tetracaine  1 drop Both Eyes Daily  . vitamin C  500 mg Oral Daily  . vitamin E  100 Units Oral Daily   Continuous Infusions: . sodium chloride 100 mL/hr at 08/31/19 0000  .  ceFAZolin (ANCEF) IV Stopped (08/30/19 2048)   PRN Meds: acetaminophen, nitroGLYCERIN, ondansetron (ZOFRAN) IV, ondansetron   Vital Signs    Vitals:   08/31/19 0244 08/31/19 0401 08/31/19 0643 08/31/19 0656  BP:  (!) 203/92 (!) 172/86 (!) 174/75  Pulse: (!) 42 (!) 55 (!) 41 (!) 36  Resp:  20 (!) 21 (!) 21  Temp:  (!) 97.4 F (36.3 C)  98.4 F (36.9 C)  TempSrc:  Oral  Oral  SpO2: 97% 97% 100% 97%  Weight:      Height:        Intake/Output Summary (Last 24 hours) at 08/31/2019 7035 Last data filed at 08/31/2019 0000 Gross per 24 hour  Intake 891.76 ml  Output -  Net 891.76 ml   Filed Weights   08/30/19 1114 08/31/19 0001  Weight: 79.4 kg 73.6 kg    Telemetry    Atrial flutter,  V rates 40s - Personally Reviewed  Physical Exam   GEN- The patient is elderly appearing, comfortable,  Sleeping but rouses Head- normocephalic, atraumatic Eyes-  Sclera clear, conjunctiva pink Ears- hearing intact Oropharynx- clear Neck- supple, Lungs-  normal work of breathing Heart- bradycardic rhythm  Extremities- no clubbing, cyanosis, or edema  MS- age appropriate atrophy   Labs    Chemistry Recent Labs  Lab 08/30/19 1130  NA 141  K  3.5  CL 103  CO2 24  GLUCOSE 111*  BUN 18  CREATININE 1.07*  CALCIUM 9.1  GFRNONAA 47*  GFRAA 54*  ANIONGAP 14     Hematology Recent Labs  Lab 08/30/19 1130  WBC 5.9  RBC 3.91  HGB 11.7*  HCT 36.0  MCV 92.1  MCH 29.9  MCHC 32.5  RDW 12.8  PLT 147*       Assessment & Plan    1.  Second degree AV block The patient has symptomatic second degree heart block with symptomatic bradycardia.  No reversible causes are identified.  I would therefore recommend pacemaker implantation at this time.  Risks, benefits, alternatives to pacemaker implantation were discussed in detail with the patient (via her son who translates) today. They understand that the risks include but are not limited to bleeding, infection, pneumothorax, perforation, tamponade, vascular damage, renal failure, MI, stroke, death,  and lead dislodgement and wishes to proceed. We will therefore schedule the procedure at the next available time.  She is tentatively scheduled for 12:30 today.  2. Atypical atrial flutter Hold xarelto for pacemaker Resume Emison when able  3. Hypertensive cardiovascular disease LVH noted on echo,  EF  preserved Will adjust medicines after pacemaker is in place.  Would follow conservatively until then.   Hillis Range MD, [facility] 08/31/2019 8:37 AM

## 2019-09-01 ENCOUNTER — Inpatient Hospital Stay (HOSPITAL_COMMUNITY): Payer: Self-pay

## 2019-09-01 LAB — BASIC METABOLIC PANEL
Anion gap: 8 (ref 5–15)
BUN: 14 mg/dL (ref 8–23)
CO2: 25 mmol/L (ref 22–32)
Calcium: 8.5 mg/dL — ABNORMAL LOW (ref 8.9–10.3)
Chloride: 107 mmol/L (ref 98–111)
Creatinine, Ser: 0.93 mg/dL (ref 0.44–1.00)
GFR calc Af Amer: >60 mL/min (ref >60)
GFR calc non Af Amer: 56 mL/min — ABNORMAL LOW (ref >60)
Glucose, Bld: 116 mg/dL — ABNORMAL HIGH (ref 70–99)
Potassium: 3.7 mmol/L (ref 3.5–5.1)
Sodium: 140 mmol/L (ref 135–145)

## 2019-09-01 MED ORDER — INFLUENZA VAC A&B SA ADJ QUAD 0.5 ML IM PRSY
0.5000 mL | PREFILLED_SYRINGE | INTRAMUSCULAR | Status: AC
  Administered 2019-09-01: 0.5 mL via INTRAMUSCULAR
  Filled 2019-09-01: qty 0.5

## 2019-09-01 MED ORDER — AMLODIPINE BESYLATE 5 MG PO TABS: 5.0000 mg | ORAL_TABLET | Freq: Every day | ORAL | 1 refills | Status: DC

## 2019-09-01 NOTE — Progress Notes (Signed)
Doing well s/p PPM CXR reveals stable leads, no ptx  Device interrogation is personally reviewed and normal   DC to home today Start norvasc 5mg  daily Resume xarelto 09/02/2019 pm  Routine wound care Follow-up in the device clinic in 10 days We could consider cardioversion depending on how she feels at that time  [name] MD, [place] 09/01/2019 9:03 AM

## 2019-09-01 NOTE — Plan of Care (Signed)
  Problem: Clinical Measurements: Goal: Ability to maintain clinical measurements within normal limits will improve Outcome: Progressing   Problem: Clinical Measurements: Goal: Cardiovascular complication will be avoided Outcome: Progressing   Problem: Nutrition: Goal: Adequate nutrition will be maintained Outcome: Progressing   Problem: Pain Managment: Goal: General experience of comfort will improve Outcome: Progressing

## 2019-09-01 NOTE — Discharge Summary (Addendum)
ELECTROPHYSIOLOGY PROCEDURE DISCHARGE SUMMARY    Patient ID: Makayla Atkins,  MRN: [id number], DOB/AGE: 83-26-1931 83 y.o.  Admit date: 08/30/2019 Discharge date: 09/01/2019  Primary Care Physician: Maudie Mercury, MD Primary Cardiologist: None Electrophysiologist: Dr. Rayann Heman  Primary Discharge Diagnosis:  Symptomatic bradycardia status post pacemaker implantation this admission  Secondary Discharge Diagnosis:  Mobitz II 2nd Degree AV Block Atypical Atrial Flutter HTN  No Known Allergies   Procedures This Admission:  1.  Implantation of a St Jude Medical Assurity MRI model [id number] (serial number [id number]) pacemaker on 08/31/2019 by Dr. Rayann Heman. The patient received a Comptroller Tendril MRI model [id number] (serial number  [id number]) right atrial lead and a St Jude Medical Tendril MRI model [id number]  (serial number  [id number]) right ventricular lead. There were no immediate post procedure complications. 2.  CXR on 09/01/2019 demonstrated no pneumothorax status post device implantation.   Brief HPI: Makayla Atkins is a 85 y.o. female with past medical history of HTN and recently diagnosed atrial flutter (diagnosed in 07/2019 and started on Xarelto for anticoagulation) who presented to [facility] ED on 08/30/2019 for evaluation of weakness and arm pain. Denied any associated chest pain, dyspnea, palpitations or syncope. EKG showed atrial flutter with 4:1 block with HR of 41. Given she was not on AV nodal blocking agents, she was admitted for further management with anticipation of PPM placement. Risks, benefits, and alternatives to PPM implantation were reviewed with the patient who wished to proceed.    Hospital Course:  The patient was admitted and underwent implantation of a St. Jude PPM with details as outlined above. She  was monitored on telemetry overnight which demonstrated V-pacing. Was recommended to consider DCCV in the future pending improvement in her symptoms. She  was hypertensive and Amlodipine 5mg  daily was added to her regimen while continuing on PTA Lisinopril. Was informed to resume Xarelto on 09/02/2019. Left chest was without hematoma or ecchymosis.  The device was interrogated and found to be functioning normally. CXR was obtained and demonstrated no pneumothorax status post device implantation.  Wound care, arm mobility, and restrictions were reviewed with the patient.  The patient was examined and considered stable for discharge to home.    Physical Exam: Vitals:   08/31/19 1940 09/01/19 0000 09/01/19 0400 09/01/19 0745  BP: (!) 189/87 (!) 176/98 (!) 165/92 (!) 171/85  Pulse: 76 70 70 70  Resp: 17 17 16 20   Temp: 98.1 F (36.7 C)   98.5 F (36.9 C)  TempSrc: Oral   Oral  SpO2: 99% 100% 100% 96%  Weight:      Height:        GEN- The patient is well appearing, alert and oriented x 3 today.   HEENT: normocephalic, atraumatic; sclera clear, conjunctiva pink; hearing intact; oropharynx clear; neck supple, no JVP Lymph- no cervical lymphadenopathy Lungs- Clear to ausculation bilaterally, normal work of breathing.  No wheezes, rales, rhonchi Heart- Regular rate and rhythm, no murmurs, rubs or gallops, PMI not laterally displaced GI- soft, non-tender, non-distended, bowel sounds present, no hepatosplenomegaly Extremities- no clubbing, cyanosis, or edema; DP/PT/radial pulses 2+ bilaterally MS- no significant deformity or atrophy Skin- warm and dry, no rash or lesion, left chest without hematoma/ecchymosis Psych- euthymic mood, full affect Neuro- strength and sensation are intact   Labs:   Lab Results  Component Value Date   WBC 5.9 08/30/2019   HGB 11.7 (L) 08/30/2019   HCT 36.0 08/30/2019   MCV 92.1 08/30/2019  PLT 147 (L) 08/30/2019    Recent Labs  Lab 09/01/19 0306  NA 140  K 3.7  CL 107  CO2 25  BUN 14  CREATININE 0.93  CALCIUM 8.5*  GLUCOSE 116*    Discharge Medications:  Allergies as of 09/01/2019   No Known  Allergies     Medication List    STOP taking these medications   aspirin EC 81 MG tablet     TAKE these medications   acetaminophen 325 MG tablet Commonly known as: TYLENOL Take 650 mg by mouth every 6 (six) hours as needed for mild pain.   amLODipine 5 MG tablet Commonly known as: NORVASC Take 1 tablet (5 mg total) by mouth daily. Start taking on: September 02, 2019   atorvastatin 40 MG tablet Commonly known as: Lipitor Take 1 tablet (40 mg total) by mouth daily.   calcium-vitamin D 250-125 MG-UNIT tablet Commonly known as: OSCAL WITH D Take 1 tablet by mouth daily.   cholecalciferol 25 MCG (1000 UT) tablet Commonly known as: VITAMIN D3 Take 1,000 Units by mouth daily.   Coenzyme Q10 100 MG Tabs Take 30 mg by mouth daily.   diclofenac sodium 1 % Gel Commonly known as: VOLTAREN Apply 2 g topically 4 (four) times daily.   docusate sodium 100 MG capsule Commonly known as: COLACE Take 100 mg by mouth daily.   IRON 27 PO Take 27 mg by mouth daily.   lisinopril 40 MG tablet Commonly known as: ZESTRIL Take 1 tablet (40 mg total) by mouth daily.   omeprazole 20 MG capsule Commonly known as: PRILOSEC Take 1 capsule (20 mg total) by mouth daily.   ondansetron 4 MG tablet Commonly known as: ZOFRAN Take 1 tablet (4 mg total) by mouth every 8 (eight) hours as needed for nausea or vomiting.   rivaroxaban 20 MG Tabs tablet Commonly known as: Xarelto Take 1 tablet (20 mg total) by mouth daily with supper. Notes to patient: RESUME ON 09/02/2019   tetracaine 0.5 % ophthalmic solution Commonly known as: PONTOCAINE Place 1 drop into both eyes daily.   vitamin C 100 MG tablet Take 100 mg by mouth daily.   vitamin E 100 UNIT capsule Take 100 Units by mouth daily.       Disposition:  Discharge Instructions    Increase activity slowly   Complete by: As directed      Follow-up Information    Regan Lemming, MD Follow up on 12/03/2019.   Specialty:  Cardiology Why: at 200 pm for 3 month post pacemaker check Contact information: [address] [place] [address] [place] [address] [phone]        [facility] Follow up on 09/10/2019.   Why: at 0800 for post pacemaker wound check Contact information: [address] [place] [address] [phone]          Duration of Discharge Encounter: Greater than 30 minutes including physician time.  Signed, Ellsworth Lennox, PA-C 09/01/2019, 9:28 AM Pager: [phone]

## 2019-09-01 NOTE — Discharge Instructions (Signed)
After Your Pacemaker   Do not lift your arm above shoulder height for 1 week after your procedure. After 7 days, you may progress as below.     Sunday September 08, 2019  Monday September 09, 2019 Tuesday September 10, 2019 Wednesday September 11, 2019    Do not lift, push, pull, or carry anything over 10 pounds with the affected arm until 6 weeks (Sunday October 13, 2019) after your procedure.    Do not drive until your wound check, or until instructed by your healthcare provider that you are safe to do so.    Monitor your pacemaker site for redness, swelling, and drainage. Call the device clinic at [phone] if you experience these symptoms or fever/chills.   If your incision is sealed with Steri-strips or staples, you may shower 7 days after your procedure. Do not remove the steri-strips or let the shower hit directly on your site. You may wash around your site with soap and water. If your incision is closed with Dermabond/Surgical glue. You may shower 1 day after your pacemaker implant and wash around the site with soap and water. Avoid lotions, ointments, or perfumes over your incision until it is well-healed.   You may use a hot tub or a pool AFTER your wound check appointment if the incision is completely closed.   Your Pacemaker may be MRI compatible. We will discuss this at your first follow up/wound check. .    Remote monitoring is used to monitor your pacemaker from home. This monitoring is scheduled every 91 days by our office. It allows [place] to keep an eye on the functioning of your device to ensure it is working properly. You will routinely see your Electrophysiologist annually (more often if necessary).    Pacemaker Implantation, Care After This sheet gives you information about how to care for yourself after your procedure. Your health care provider may also give you more specific instructions. If you have problems or questions, contact your health care provider. What  can I expect after the procedure? After the procedure, it is common to have:  Mild pain.  Slight bruising.  Some swelling over the incision.  A slight bump over the skin where the device was placed. Sometimes, it is possible to feel the device under the skin. This is normal.  You should received your Pacemaker ID card within 4-8 weeks. Follow these instructions at home: Medicines  Take over-the-counter and prescription medicines only as told by your health care provider.  If you were prescribed an antibiotic medicine, take it as told by your health care provider. Do not stop taking the antibiotic even if you start to feel better. Wound care     Do not remove the bandage on your chest until directed to do so by your health care provider.  After your bandage is removed, you may see pieces of tape called skin adhesive strips over the area where the cut was made (incision site). Let them fall off on their own.  Check the incision site every day to make sure it is not infected, bleeding, or starting to pull apart.  Do not use lotions or ointments near the incision site unless directed to do so.  Keep the incision area clean and dry for 7 days after the procedure or as directed by your health care provider. It takes several weeks for the incision site to completely heal.  Do not take baths, swim, or use a hot tub for 7-10 days or as  otherwise directed by your health care provider. Activity  Do not drive or use heavy machinery while taking prescription pain medicine.  Do not drive for 24 hours if you were given a medicine to help you relax (sedative).  Check with your health care provider before you start to drive or play sports.  Avoid sudden jerking, pulling, or chopping movements that pull your upper arm far away from your body. Avoid these movements for at least 6 weeks or as long as told by your health care provider.  Do not lift your upper arm above your shoulders for at  least 6 weeks or as long as told by your health care provider. This means no tennis, golf, or swimming.  You may go back to work when your health care provider says it is okay. Pacemaker care  You may be shown how to transfer data from your pacemaker through the phone to your health care provider.  Always let all health care providers know about your pacemaker before you have any medical procedures or tests.  Wear a medical ID bracelet or necklace stating that you have a pacemaker. Carry a pacemaker ID card with you at all times.  Your pacemaker battery will last for 5-15 years. Routine checks by your health care provider will let the health care provider know when the battery is starting to run down. The pacemaker will need to be replaced when the battery starts to run down.  Do not use amateur Proofreader. Other electrical devices are safe to use, including power tools, lawn mowers, and speakers. If you are unsure of whether something is safe to use, ask your health care provider.  When using your cell phone, hold it to the ear opposite the pacemaker. Do not leave your cell phone in a pocket over the pacemaker.  Avoid places or objects that have a strong electric or magnetic field, including: ? Airport Actuary. When at the airport, let officials know that you have a pacemaker. ? Power plants. ? Large electrical generators. ? Radiofrequency transmission towers, such as cell phone and radio towers. General instructions  Weigh yourself every day. If you suddenly gain weight, fluid may be building up in your body.  Keep all follow-up visits as told by your health care provider. This is important. Contact a health care provider if:  You gain weight suddenly.  Your legs or feet swell.  It feels like your heart is fluttering or skipping beats (heart palpitations).  You have chills or a fever.  You have more redness, swelling, or pain around your  incisions.  You have more fluid or blood coming from your incisions.  Your incisions feel warm to the touch.  You have pus or a bad smell coming from your incisions. Get help right away if:  You have chest pain.  You have trouble breathing or are short of breath.  You become extremely tired.  You are light-headed or you faint. This information is not intended to replace advice given to you by your health care provider. Make sure you discuss any questions you have with your health care provider.

## 2019-09-02 ENCOUNTER — Encounter (HOSPITAL_COMMUNITY): Payer: Self-pay | Admitting: Internal Medicine

## 2019-09-05 ENCOUNTER — Ambulatory Visit (INDEPENDENT_AMBULATORY_CARE_PROVIDER_SITE_OTHER): Payer: Self-pay | Admitting: Internal Medicine

## 2019-09-05 ENCOUNTER — Encounter: Payer: Self-pay | Admitting: Internal Medicine

## 2019-09-05 ENCOUNTER — Other Ambulatory Visit: Payer: Self-pay

## 2019-09-05 VITALS — BP 148/77 | HR 70 | Temp 98.4°F | Ht 64.0 in | Wt 159.9 lb

## 2019-09-05 DIAGNOSIS — Z950 Presence of cardiac pacemaker: Secondary | ICD-10-CM

## 2019-09-05 DIAGNOSIS — G8918 Other acute postprocedural pain: Secondary | ICD-10-CM

## 2019-09-05 DIAGNOSIS — I442 Atrioventricular block, complete: Secondary | ICD-10-CM

## 2019-09-05 MED ORDER — HYDROCODONE-ACETAMINOPHEN 5-325 MG PO TABS: 1.0000 | ORAL_TABLET | Freq: Two times a day (BID) | ORAL | 0 refills | Status: AC

## 2019-09-05 NOTE — Assessment & Plan Note (Signed)
Patient presents to the clinic s/p pacemaker placement for her complete heartblock. Patient states that the area around the surgical site is painful and is keeping her up at night. Patient states that her tylenol has not helped alleviate the pain. During her hospital course she was given norco/vicodin 5-325 which did alleviate her pain. The site around the surgical incision is clean with no signs of infection or drainage. A small three day course of norco vicodin with no refills will be ordered. Patient has follow up appointment with cardiology next week.  - Ordered Norco/Vicodin 5-325 BID  - Instructed to take medicine as indicated and to stop if patient has adverse effects. Patient educated on adverse effects.

## 2019-09-05 NOTE — Progress Notes (Signed)
   CC: Chest Pain S/P Pacemaker placement  HPI:  [name] is a 85 y.o. female, with a PMH noted below, who presents to the clinic with chest pain after her pacemaker placement. To see the management of her acute and chronic conditions, please see the A&P note.   Past Medical History:  Diagnosis Date  . Glaucoma   . Hypertension    Review of Systems:   Review of Systems  Constitutional: Negative for chills, fever and weight loss.  Eyes: Negative for blurred vision, double vision and photophobia.  Cardiovascular: Negative for chest pain and palpitations.  Gastrointestinal: Negative for abdominal pain, constipation, diarrhea, nausea and vomiting.  Neurological: Negative for dizziness and headaches.     Physical Exam:  Vitals:   09/05/19 1552  BP: (!) 148/77  Pulse: 70  Temp: 98.4 F (36.9 C)  TempSrc: Oral  SpO2: 97%  Weight: 159 lb 14.4 oz (72.5 kg)  Height: 5\' 4"  (1.626 m)   Physical Exam Constitutional:      General: She is not in acute distress.    Appearance: She is well-developed. She is not ill-appearing or toxic-appearing.  HENT:     Head: Normocephalic and atraumatic.  Cardiovascular:     Rate and Rhythm: Normal rate and regular rhythm.     Heart sounds: No murmur. No friction rub. No gallop.   Pulmonary:     Effort: Pulmonary effort is normal.     Breath sounds: No decreased breath sounds, wheezing, rhonchi or rales.  Abdominal:     General: Bowel sounds are normal.     Palpations: Abdomen is soft.  Musculoskeletal:     Right lower leg: She exhibits no tenderness. No edema.     Left lower leg: She exhibits no tenderness. No edema.  Skin:    General: Skin is warm.     Coloration: Skin is not cyanotic.     Findings: No erythema.     Comments: Bandages around surgical site appear clean with no signs of blood, erythema, or drainage. Skin is healing well.   Neurological:     Mental Status: She is alert and oriented to person, place, and time.   Psychiatric:        Mood and Affect: Mood normal.        Behavior: Behavior normal.     Assessment & Plan:   See Encounters Tab for problem based charting.  Patient seen with Dr. Philipp Ovens

## 2019-09-05 NOTE — Patient Instructions (Addendum)
Makayla Atkins,   It was a pleasure meeting you today! Today we discussed your chest pain after your pacemaker. Your wound does not appear to be infected, but we will give you a short dose of pain medicine. Please take the medication as indicated, and stop the medication if you become too sleepy, or feel short of breath. I look forward to working with you again. Happy holidays! Sincerely,  Maudie Mercury

## 2019-09-10 ENCOUNTER — Ambulatory Visit (INDEPENDENT_AMBULATORY_CARE_PROVIDER_SITE_OTHER): Payer: Self-pay | Admitting: Student

## 2019-09-10 ENCOUNTER — Other Ambulatory Visit: Payer: Self-pay

## 2019-09-10 DIAGNOSIS — I442 Atrioventricular block, complete: Secondary | ICD-10-CM

## 2019-09-10 LAB — CUP PACEART INCLINIC DEVICE CHECK
Battery Remaining Longevity: 78 mo
Battery Voltage: 3.04 V
Brady Statistic RA Percent Paced: 1.3 %
Brady Statistic RV Percent Paced: 97 %
Date Time Interrogation Session: 20201117084329
Implantable Lead Implant Date: 20201107
Implantable Lead Implant Date: 20201107
Implantable Lead Location: 753859
Implantable Lead Location: 753860
Implantable Pulse Generator Implant Date: 20201107
Lead Channel Impedance Value: 412.5 Ohm
Lead Channel Impedance Value: 512.5 Ohm
Lead Channel Pacing Threshold Amplitude: 0.5 V
Lead Channel Pacing Threshold Amplitude: 0.5 V
Lead Channel Pacing Threshold Amplitude: 0.5 V
Lead Channel Pacing Threshold Amplitude: 0.5 V
Lead Channel Pacing Threshold Pulse Width: 0.5 ms
Lead Channel Pacing Threshold Pulse Width: 0.5 ms
Lead Channel Pacing Threshold Pulse Width: 0.5 ms
Lead Channel Pacing Threshold Pulse Width: 0.5 ms
Lead Channel Sensing Intrinsic Amplitude: 1 mV
Lead Channel Sensing Intrinsic Amplitude: 12 mV
Lead Channel Setting Pacing Amplitude: 3.5 V
Lead Channel Setting Pacing Amplitude: 3.5 V
Lead Channel Setting Pacing Pulse Width: 0.5 ms
Lead Channel Setting Sensing Sensitivity: 4 mV
Pulse Gen Model: 2272
Pulse Gen Serial Number: 9175795

## 2019-09-10 NOTE — Progress Notes (Signed)
Wound check appointment. Steri-strips removed. Wound without redness or edema. Incision edges approximated, wound well healed. Normal device function. Thresholds, sensing, and impedances consistent with implant measurements. Device programmed at 3.5V for extra safety margin until 3 month visit. Histogram distribution appropriate for patient and level of activity. Numerous mode switches noted, attributed to far field oversensing. PVAB increased from 70 -> 120 ms with resolution. All available EGMs showed false due to oversensing. No high ventricular rates noted. Patient educated about wound care, arm mobility, lifting restrictions. ROV in 3 months with Dr. Rayann Heman.

## 2019-09-11 NOTE — Progress Notes (Signed)
Internal Medicine Clinic Attending  I saw and evaluated the patient.  I personally confirmed the key portions of the history and exam documented by Dr. Winters and I reviewed pertinent patient test results.  The assessment, diagnosis, and plan were formulated together and I agree with the documentation in the resident's note.  

## 2019-09-16 ENCOUNTER — Ambulatory Visit: Payer: Self-pay

## 2019-11-25 ENCOUNTER — Other Ambulatory Visit: Payer: Self-pay | Admitting: Internal Medicine

## 2019-11-25 MED FILL — XARELTO 20 MG TABLET: 20 | 30 days supply | Qty: 30 | Fill #1

## 2019-12-02 ENCOUNTER — Other Ambulatory Visit: Payer: Self-pay | Admitting: Internal Medicine

## 2019-12-03 ENCOUNTER — Encounter: Payer: Self-pay | Admitting: Cardiology

## 2019-12-06 ENCOUNTER — Encounter: Payer: Self-pay | Admitting: Internal Medicine

## 2019-12-06 ENCOUNTER — Telehealth (INDEPENDENT_AMBULATORY_CARE_PROVIDER_SITE_OTHER): Payer: Self-pay | Admitting: Internal Medicine

## 2019-12-06 ENCOUNTER — Telehealth: Payer: Self-pay | Admitting: *Deleted

## 2019-12-06 VITALS — BP 139/73 | HR 66 | Ht 64.0 in | Wt 170.0 lb

## 2019-12-06 DIAGNOSIS — I484 Atypical atrial flutter: Secondary | ICD-10-CM

## 2019-12-06 DIAGNOSIS — I483 Typical atrial flutter: Secondary | ICD-10-CM

## 2019-12-06 DIAGNOSIS — I442 Atrioventricular block, complete: Secondary | ICD-10-CM

## 2019-12-06 DIAGNOSIS — I10 Essential (primary) hypertension: Secondary | ICD-10-CM

## 2019-12-06 DIAGNOSIS — I119 Hypertensive heart disease without heart failure: Secondary | ICD-10-CM

## 2019-12-06 DIAGNOSIS — E785 Hyperlipidemia, unspecified: Secondary | ICD-10-CM

## 2019-12-06 MED ORDER — ATORVASTATIN CALCIUM 40 MG PO TABS: 40.0000 mg | ORAL_TABLET | Freq: Every day | ORAL | 6 refills | Status: DC

## 2019-12-06 MED ORDER — LISINOPRIL 40 MG PO TABS: 40.0000 mg | ORAL_TABLET | Freq: Every day | ORAL | 6 refills | Status: DC

## 2019-12-06 MED ORDER — RIVAROXABAN 20 MG PO TABS: 20.0000 mg | ORAL_TABLET | Freq: Every day | ORAL | 6 refills | Status: DC

## 2019-12-06 MED ORDER — AMLODIPINE BESYLATE 5 MG PO TABS: 5.0000 mg | ORAL_TABLET | Freq: Every day | ORAL | 6 refills | Status: DC

## 2019-12-06 NOTE — Progress Notes (Signed)
Electrophysiology TeleHealth Note   Due to national recommendations of social distancing due to COVID 19, an audio/video telehealth visit is felt to be most appropriate for this patient at this time.  See MyChart message from today for the patient's consent to telehealth for [facility].    Date:  12/06/2019   ID:  Tessie Eke, DOB 1930-07-21, MRN [id number]  Location: patient's home  Provider location:  [facility]  Evaluation Performed: Follow-up visit  PCP:  Dolan Amen, MD   Electrophysiologist:  Dr Johney Frame  Chief Complaint:  Pacemaker follow up  History of Present Illness:    Miliana Gangwer is a 86 y.o. female who presents via telehealth conferencing today.  Since last being seen in our clinic, the patient reports doing very well. Her visit is done today with the help of her daughter in law. History is obtained from patient and daughter in law.  Today, she denies symptoms of palpitations, chest pain, shortness of breath,  lower extremity edema, dizziness, presyncope, or syncope.  The patient is otherwise without complaint today.  The patient denies symptoms of fevers, chills, cough, or new SOB worrisome for COVID 19.  Past Medical History:  Diagnosis Date  . Glaucoma   . Hypertension     Past Surgical History:  Procedure Laterality Date  . PACEMAKER IMPLANT N/A 08/31/2019   Procedure: PACEMAKER IMPLANT;  Surgeon: Hillis Range, MD;  Location: [facility] INVASIVE CV LAB;  Service: Cardiovascular;  Laterality: N/A;    Current Outpatient Medications  Medication Sig Dispense Refill  . acetaminophen (TYLENOL) 325 MG tablet Take 650 mg by mouth every 6 (six) hours as needed for mild pain.    Marland Kitchen amLODipine (NORVASC) 5 MG tablet Take 1 tablet (5 mg total) by mouth daily. 90 tablet 1  . Ascorbic Acid (VITAMIN C) 100 MG tablet Take 100 mg by mouth daily.    Marland Kitchen atorvastatin (LIPITOR) 40 MG tablet Take 1 tablet (40 mg total) by mouth daily. 90 tablet 3  . calcium-vitamin D  (OSCAL WITH D) 250-125 MG-UNIT tablet Take 1 tablet by mouth daily.    . cholecalciferol (VITAMIN D3) 25 MCG (1000 UT) tablet Take 1,000 Units by mouth daily.    . Coenzyme Q10 100 MG TABS Take 30 mg by mouth daily.    . diclofenac sodium (VOLTAREN) 1 % GEL Apply 2 g topically 4 (four) times daily. 100 g 1  . docusate sodium (COLACE) 100 MG capsule Take 100 mg by mouth daily.    . Ferrous Gluconate (IRON 27 PO) Take 27 mg by mouth daily.    Marland Kitchen lisinopril (ZESTRIL) 40 MG tablet Take 1 tablet (40 mg total) by mouth daily. 90 tablet 3  . omeprazole (PRILOSEC) 20 MG capsule Take 1 capsule (20 mg total) by mouth daily. 30 capsule 1  . ondansetron (ZOFRAN) 4 MG tablet Take 1 tablet (4 mg total) by mouth every 8 (eight) hours as needed for nausea or vomiting. 20 tablet 0  . rivaroxaban (XARELTO) 20 MG TABS tablet Take 1 tablet (20 mg total) by mouth daily with supper. 30 tablet 3  . tetracaine (PONTOCAINE) 0.5 % ophthalmic solution Place 1 drop into both eyes daily.    . vitamin E 100 UNIT capsule Take 100 Units by mouth daily.     No current facility-administered medications for this visit.    Allergies:   Patient has no known allergies.   Social History:  The patient  reports that she has never smoked. She  has never used smokeless tobacco. She reports that she does not drink alcohol or use drugs.   ROS:  Please see the history of present illness.   All other systems are personally reviewed and negative.    Exam:    Vital Signs:  BP 139/73   Pulse 66   Ht 5\' 4"  (1.626 m)   Wt 170 lb (77.1 kg)   BMI 29.18 kg/m   In wheelchair, frail appearing. Well sounding, alert and conversant, regular work of breathing,  good skin color Eyes- anicteric, neuro- grossly intact, skin- no apparent rash or lesions or cyanosis, mouth- oral mucosa is pink  Labs/Other Tests and Data Reviewed:    Recent Labs: 07/22/2019: ALT 16 08/30/2019: Hemoglobin 11.7; Platelets 147 09/01/2019: BUN 14; Creatinine, Ser  0.93; Potassium 3.7; Sodium 140   Wt Readings from Last 3 Encounters:  12/06/19 170 lb (77.1 kg)  09/05/19 159 lb 14.4 oz (72.5 kg)  08/31/19 162 lb 4.1 oz (73.6 kg)     Last device remote is reviewed from [facility] PDF which reveals normal device function   ASSESSMENT & PLAN:    1.  Mobitz II Normal pacemaker function by last remote See PaceArt report Scheduled remote for next week Will need in office follow up to reprogram outputs   2.  Atypical atrial flutter Continue Xarelto No bleeding issues  If still in flutter at follow up, consider reprogramming VVIR   3.  Hypertensive cardiovascular disease Stable No change required today Will refill Amlodipine today    Follow-up:  [facility], 6 months with EP NP    Patient Risk:  after full review of this patients clinical status, I feel that they are at moderate risk at this time.  Today, I have spent 15 minutes with the patient with telehealth technology discussing arrhythmia management .    Army Fossa, MD  12/06/2019 3:05 PM     [facility] [address] [place] [address] [phone] (office) [phone] (fax)

## 2019-12-06 NOTE — Telephone Encounter (Signed)
-----   Message from Hillis Range, MD sent at 12/06/2019  3:09 PM EST ----- Please renew refills for amlodipine, atorvastatin, lisinopril, and xarelto. Please call her daughter to see which ones she needs now   Follow-up with Amber in 6 months

## 2019-12-06 NOTE — Telephone Encounter (Signed)
Spoke to son. Refills sent in as requested by son. He requested 30 day w/ refills.

## 2019-12-13 ENCOUNTER — Ambulatory Visit (INDEPENDENT_AMBULATORY_CARE_PROVIDER_SITE_OTHER): Payer: Self-pay | Admitting: *Deleted

## 2019-12-13 DIAGNOSIS — I442 Atrioventricular block, complete: Secondary | ICD-10-CM

## 2019-12-13 LAB — CUP PACEART REMOTE DEVICE CHECK
Battery Remaining Longevity: 59 mo
Battery Remaining Percentage: 95.5 %
Battery Voltage: 2.98 V
Brady Statistic AP VP Percent: 59 %
Brady Statistic AP VS Percent: 1 %
Brady Statistic AS VP Percent: 40 %
Brady Statistic AS VS Percent: 1 %
Brady Statistic RA Percent Paced: 60 %
Brady Statistic RV Percent Paced: 99 %
Date Time Interrogation Session: 20210219020027
Implantable Lead Implant Date: 20201107
Implantable Lead Implant Date: 20201107
Implantable Lead Location: 753859
Implantable Lead Location: 753860
Implantable Pulse Generator Implant Date: 20201107
Lead Channel Impedance Value: 410 Ohm
Lead Channel Impedance Value: 530 Ohm
Lead Channel Pacing Threshold Amplitude: 0.5 V
Lead Channel Pacing Threshold Amplitude: 0.5 V
Lead Channel Pacing Threshold Pulse Width: 0.5 ms
Lead Channel Pacing Threshold Pulse Width: 0.5 ms
Lead Channel Sensing Intrinsic Amplitude: 12 mV
Lead Channel Sensing Intrinsic Amplitude: 3.5 mV
Lead Channel Setting Pacing Amplitude: 3.5 V
Lead Channel Setting Pacing Amplitude: 3.5 V
Lead Channel Setting Pacing Pulse Width: 0.5 ms
Lead Channel Setting Sensing Sensitivity: 4 mV
Pulse Gen Model: 2272
Pulse Gen Serial Number: 9175795

## 2019-12-13 NOTE — Progress Notes (Signed)
PPM Remote  

## 2020-01-06 MED FILL — XARELTO 20 MG TABLET: 20 | 30 days supply | Qty: 30 | Fill #2

## 2020-02-06 MED FILL — XARELTO 20 MG TABLET: 20 | 30 days supply | Qty: 30 | Fill #3

## 2020-02-26 ENCOUNTER — Other Ambulatory Visit: Payer: Self-pay | Admitting: Internal Medicine

## 2020-02-26 DIAGNOSIS — I483 Typical atrial flutter: Secondary | ICD-10-CM

## 2020-02-26 MED FILL — LISINOPRIL 40 MG TABS: 40 | 90 days supply | Qty: 90 | Fill #0

## 2020-02-28 MED FILL — XARELTO 20 MG TABLET: 20 | 30 days supply | Qty: 30 | Fill #0

## 2020-03-13 ENCOUNTER — Ambulatory Visit (INDEPENDENT_AMBULATORY_CARE_PROVIDER_SITE_OTHER): Payer: Self-pay | Admitting: *Deleted

## 2020-03-13 DIAGNOSIS — I442 Atrioventricular block, complete: Secondary | ICD-10-CM

## 2020-03-13 LAB — CUP PACEART REMOTE DEVICE CHECK
Battery Remaining Longevity: 58 mo
Battery Remaining Percentage: 95.5 %
Battery Voltage: 2.98 V
Brady Statistic AP VP Percent: 59 %
Brady Statistic AP VS Percent: 1 %
Brady Statistic AS VP Percent: 39 %
Brady Statistic AS VS Percent: 1 %
Brady Statistic RA Percent Paced: 59 %
Brady Statistic RV Percent Paced: 99 %
Date Time Interrogation Session: 20210521020014
Implantable Lead Implant Date: 20201107
Implantable Lead Implant Date: 20201107
Implantable Lead Location: 753859
Implantable Lead Location: 753860
Implantable Pulse Generator Implant Date: 20201107
Lead Channel Impedance Value: 440 Ohm
Lead Channel Impedance Value: 510 Ohm
Lead Channel Pacing Threshold Amplitude: 0.5 V
Lead Channel Pacing Threshold Amplitude: 0.5 V
Lead Channel Pacing Threshold Pulse Width: 0.5 ms
Lead Channel Pacing Threshold Pulse Width: 0.5 ms
Lead Channel Sensing Intrinsic Amplitude: 12 mV
Lead Channel Sensing Intrinsic Amplitude: 3.8 mV
Lead Channel Setting Pacing Amplitude: 3.5 V
Lead Channel Setting Pacing Amplitude: 3.5 V
Lead Channel Setting Pacing Pulse Width: 0.5 ms
Lead Channel Setting Sensing Sensitivity: 4 mV
Pulse Gen Model: 2272
Pulse Gen Serial Number: 9175795

## 2020-03-16 NOTE — Progress Notes (Signed)
Remote pacemaker transmission.   

## 2020-04-06 MED FILL — XARELTO 20 MG TABLET: 20 | 30 days supply | Qty: 30 | Fill #1

## 2020-05-06 MED FILL — XARELTO 20 MG TABLET: 20 | 30 days supply | Qty: 30 | Fill #2

## 2020-05-30 ENCOUNTER — Other Ambulatory Visit: Payer: Self-pay | Admitting: Internal Medicine

## 2020-05-30 DIAGNOSIS — E785 Hyperlipidemia, unspecified: Secondary | ICD-10-CM

## 2020-06-01 MED FILL — LISINOPRIL 40 MG TABS: 40 | 30 days supply | Qty: 30 | Fill #1

## 2020-06-02 MED ORDER — AMLODIPINE BESYLATE 5 MG PO TABS: 5.0000 mg | ORAL_TABLET | Freq: Every day | ORAL | 1 refills | Status: DC

## 2020-06-02 MED ORDER — ATORVASTATIN CALCIUM 40 MG PO TABS: 40.0000 mg | ORAL_TABLET | Freq: Every day | ORAL | 1 refills | Status: DC

## 2020-06-02 NOTE — Addendum Note (Signed)
Addended by: Airyanna Pyle D on: 06/02/2020 12:19 PM   Modules accepted: Orders

## 2020-06-08 MED FILL — XARELTO 20 MG TABLET: 20 | 30 days supply | Qty: 30 | Fill #3

## 2020-06-12 ENCOUNTER — Ambulatory Visit (INDEPENDENT_AMBULATORY_CARE_PROVIDER_SITE_OTHER): Payer: Self-pay | Admitting: *Deleted

## 2020-06-12 DIAGNOSIS — I442 Atrioventricular block, complete: Secondary | ICD-10-CM

## 2020-06-12 LAB — CUP PACEART REMOTE DEVICE CHECK
Battery Remaining Longevity: 60 mo
Battery Remaining Percentage: 95.5 %
Battery Voltage: 2.98 V
Brady Statistic AP VP Percent: 64 %
Brady Statistic AP VS Percent: 1 %
Brady Statistic AS VP Percent: 35 %
Brady Statistic AS VS Percent: 1 %
Brady Statistic RA Percent Paced: 64 %
Brady Statistic RV Percent Paced: 99 %
Date Time Interrogation Session: 20210820020013
Implantable Lead Implant Date: 20201107
Implantable Lead Implant Date: 20201107
Implantable Lead Location: 753859
Implantable Lead Location: 753860
Implantable Pulse Generator Implant Date: 20201107
Lead Channel Impedance Value: 410 Ohm
Lead Channel Impedance Value: 510 Ohm
Lead Channel Pacing Threshold Amplitude: 0.5 V
Lead Channel Pacing Threshold Amplitude: 0.5 V
Lead Channel Pacing Threshold Pulse Width: 0.5 ms
Lead Channel Pacing Threshold Pulse Width: 0.5 ms
Lead Channel Sensing Intrinsic Amplitude: 12 mV
Lead Channel Sensing Intrinsic Amplitude: 3.7 mV
Lead Channel Setting Pacing Amplitude: 3.5 V
Lead Channel Setting Pacing Amplitude: 3.5 V
Lead Channel Setting Pacing Pulse Width: 0.5 ms
Lead Channel Setting Sensing Sensitivity: 4 mV
Pulse Gen Model: 2272
Pulse Gen Serial Number: 9175795

## 2020-06-15 NOTE — Progress Notes (Signed)
Remote pacemaker transmission.   

## 2020-07-03 ENCOUNTER — Other Ambulatory Visit: Payer: Self-pay | Admitting: Internal Medicine

## 2020-07-03 DIAGNOSIS — I483 Typical atrial flutter: Secondary | ICD-10-CM

## 2020-07-03 DIAGNOSIS — I10 Essential (primary) hypertension: Secondary | ICD-10-CM

## 2020-07-03 MED FILL — LISINOPRIL 40 MG TABS: 40 | 30 days supply | Qty: 30 | Fill #0

## 2020-07-03 NOTE — Telephone Encounter (Signed)
Prescription refill request for Xarelto received.   Last office visit: [name], 12/06/2019 Weight: 77.1kg Age: 86 y.o. Scr: 0.93, 09/01/2019 CrCl: 61 ml/min   Prescription refill sent.

## 2020-07-06 ENCOUNTER — Telehealth: Payer: Self-pay

## 2020-07-06 NOTE — Telephone Encounter (Signed)
I would anticipate this is merely from refilling a previous Rx and not due to any nefarious purpose.  We can ask Dr. Johney Frame to have the PCP fill them.  If patient no longer with [place], Rx instructions need to be removed.  Thanks! Debe Coder, MD

## 2020-07-06 NOTE — Telephone Encounter (Signed)
Received TC from [facility] at [facility] pharmacy asking if Dr. Johney Frame is with the [facility] because they have received a RX for Xarelto from Dr. Johney Frame.  Per chart review, Dr. Johney Frame is w/ Cardiology and Sunny Schlein is notified of this.  She states since May, 2021, Dr. Johney Frame has sent in RX's for lisinopril and Xarelto and has written IM program on RX's.  Felicia informed IM program can only be used for patients getting RX's written by MD's at the [facility], other practices in [place] cannot deplete the IM fund by using this program.  Will forward to program Soil scientist to make them aware of above. Thank you, SChaplin, RN,BSN

## 2020-08-07 MED FILL — XARELTO 20 MG TABLET: 20 | 30 days supply | Qty: 30 | Fill #0

## 2020-08-07 MED FILL — LISINOPRIL 40 MG TABS: 40 | 30 days supply | Qty: 30 | Fill #1

## 2020-08-11 ENCOUNTER — Ambulatory Visit (INDEPENDENT_AMBULATORY_CARE_PROVIDER_SITE_OTHER): Payer: Self-pay | Admitting: Internal Medicine

## 2020-08-11 ENCOUNTER — Other Ambulatory Visit: Payer: Self-pay

## 2020-08-11 ENCOUNTER — Encounter: Payer: Self-pay | Admitting: Internal Medicine

## 2020-08-11 ENCOUNTER — Ambulatory Visit (HOSPITAL_COMMUNITY)
Admission: RE | Admit: 2020-08-11 | Discharge: 2020-08-11 | Disposition: A | Discharge status: Home / Self Care | Payer: Self-pay | Source: Ambulatory Visit | Attending: Internal Medicine | Admitting: Internal Medicine

## 2020-08-11 VITALS — BP 134/66 | HR 63 | Temp 98.2°F | Ht 64.0 in | Wt 164.9 lb

## 2020-08-11 DIAGNOSIS — M25531 Pain in right wrist: Secondary | ICD-10-CM

## 2020-08-11 DIAGNOSIS — M654 Radial styloid tenosynovitis [de Quervain]: Secondary | ICD-10-CM

## 2020-08-11 DIAGNOSIS — Z608 Other problems related to social environment: Secondary | ICD-10-CM

## 2020-08-11 DIAGNOSIS — I10 Essential (primary) hypertension: Secondary | ICD-10-CM

## 2020-08-11 MED ORDER — MELOXICAM 7.5 MG PO TABS: 7.5000 mg | ORAL_TABLET | Freq: Every day | ORAL | 0 refills | Status: AC

## 2020-08-11 NOTE — Patient Instructions (Signed)
Thank you, Ms.Cherylynn Terpening for allowing [place] to provide your care today. Today we discussed wrist pain.    I have ordered the following labs for you:   Lab Orders     BMP8+Anion Gap   Tests ordered today:  - Xray of right wrist  Referrals ordered today:   Referral Orders  No referral(s) requested today     I have ordered the following medication/changed the following medications:   Stop the following medications: There are no discontinued medications.   Start the following medications: Meds ordered this encounter  Medications  . meloxicam (MOBIC) 7.5 MG tablet    Sig: Take 1 tablet (7.5 mg total) by mouth daily for 7 days.    Dispense:  7 tablet    Refill:  0     Follow up: with PCP    Remember: please make a follow up appointment if this does not improve.  Should you have any questions or concerns please call the internal medicine clinic at [phone].     Dellia Cloud, D.O. [facility]

## 2020-08-11 NOTE — Progress Notes (Signed)
CC: Wrist pain  HPI:  [name] is a 86 y.o. female with a past medical history stated below and presents today for wrist pain. Please see problem based assessment and plan for additional details.  Past Medical History:  Diagnosis Date  . Glaucoma   . Hypertension     Current Outpatient Medications on File Prior to Visit  Medication Sig Dispense Refill  . acetaminophen (TYLENOL) 325 MG tablet Take 650 mg by mouth every 6 (six) hours as needed for mild pain.    Marland Kitchen amLODipine (NORVASC) 5 MG tablet Take 1 tablet (5 mg total) by mouth daily. 90 tablet 1  . Ascorbic Acid (VITAMIN C) 100 MG tablet Take 100 mg by mouth daily.    Marland Kitchen atorvastatin (LIPITOR) 40 MG tablet Take 1 tablet (40 mg total) by mouth daily. 90 tablet 1  . calcium-vitamin D (OSCAL WITH D) 250-125 MG-UNIT tablet Take 1 tablet by mouth daily.    . cholecalciferol (VITAMIN D3) 25 MCG (1000 UT) tablet Take 1,000 Units by mouth daily.    . Coenzyme Q10 100 MG TABS Take 30 mg by mouth daily.    . diclofenac sodium (VOLTAREN) 1 % GEL Apply 2 g topically 4 (four) times daily. 100 g 1  . docusate sodium (COLACE) 100 MG capsule Take 100 mg by mouth daily.    . Ferrous Gluconate (IRON 27 PO) Take 27 mg by mouth daily.    Marland Kitchen lisinopril (ZESTRIL) 40 MG tablet TAKE 1 TABLET BY MOUTH DAILY 30 tablet 5  . omeprazole (PRILOSEC) 20 MG capsule Take 1 capsule (20 mg total) by mouth daily. 30 capsule 1  . ondansetron (ZOFRAN) 4 MG tablet Take 1 tablet (4 mg total) by mouth every 8 (eight) hours as needed for nausea or vomiting. 20 tablet 0  . tetracaine (PONTOCAINE) 0.5 % ophthalmic solution Place 1 drop into both eyes daily.    . vitamin E 100 UNIT capsule Take 100 Units by mouth daily.    Makayla Atkins 20 MG TABS tablet TAKE 1 TABLET (20 MG TOTAL) BY MOUTH DAILY WITH SUPPER. 30 tablet 5   No current facility-administered medications on file prior to visit.    No family history on file.  Social History   Socioeconomic History  .  Marital status: Widowed    Spouse name: Not on file  . Number of children: Not on file  . Years of education: Not on file  . Highest education level: Not on file  Occupational History  . Not on file  Tobacco Use  . Smoking status: Never Smoker  . Smokeless tobacco: Never Used  Substance and Sexual Activity  . Alcohol use: Never  . Drug use: Never  . Sexual activity: Not on file  Other Topics Concern  . Not on file  Social History Narrative  . Not on file   Social Determinants of Health   Financial Resource Strain:   . Difficulty of Paying Living Expenses: Not on file  Food Insecurity:   . Worried About Programme researcher, broadcasting/film/video in the Last Year: Not on file  . Ran Out of Food in the Last Year: Not on file  Transportation Needs:   . Lack of Transportation (Medical): Not on file  . Lack of Transportation (Non-Medical): Not on file  Physical Activity:   . Days of Exercise per Week: Not on file  . Minutes of Exercise per Session: Not on file  Stress:   . Feeling of Stress : Not  on file  Social Connections:   . Frequency of Communication with Friends and Family: Not on file  . Frequency of Social Gatherings with Friends and Family: Not on file  . Attends Religious Services: Not on file  . Active Member of Clubs or Organizations: Not on file  . Attends Banker Meetings: Not on file  . Marital Status: Not on file  Intimate Partner Violence:   . Fear of Current or Ex-Partner: Not on file  . Emotionally Abused: Not on file  . Physically Abused: Not on file  . Sexually Abused: Not on file    Review of Systems: ROS negative except for what is noted on the assessment and plan.  Vitals:   08/11/20 1422  BP: 134/66  Pulse: 63  Temp: 98.2 F (36.8 C)  TempSrc: Oral  SpO2: 97%  Weight: 164 lb 14.4 oz (74.8 kg)  Height: 5\' 4"  (1.626 m)     Physical Exam: Physical Exam Constitutional:      Appearance: Normal appearance.  HENT:     Head: Normocephalic and  atraumatic.  Musculoskeletal:     Comments: Patient has pain over the right wrist at the base of the right thumb.  5 out of 5 strength and 2+ pulses sensation is intact.  Patient has negative Phalen's and Tinel's test.  Positive Finkelstein's test  Skin:    General: Skin is warm and dry.     Findings: No bruising, erythema or rash.  Neurological:     General: No focal deficit present.     Mental Status: She is alert. Mental status is at baseline.  Psychiatric:        Mood and Affect: Mood normal.      Assessment & Plan:   See Encounters Tab for problem based charting.  Patient discussed with Dr. , D.O. [facility], PGY-2 Pager: [phone], Phone: [phone] Date 08/12/2020 Time 7:38 AM

## 2020-08-12 ENCOUNTER — Encounter: Payer: Self-pay | Admitting: Internal Medicine

## 2020-08-12 DIAGNOSIS — M654 Radial styloid tenosynovitis [de Quervain]: Secondary | ICD-10-CM | POA: Insufficient documentation

## 2020-08-12 LAB — BMP8+ANION GAP
Anion Gap: 17 mmol/L (ref 10.0–18.0)
BUN/Creatinine Ratio: 13 (ref 12–28)
BUN: 13 mg/dL (ref 8–27)
CO2: 24 mmol/L (ref 20–29)
Calcium: 9.3 mg/dL (ref 8.7–10.3)
Chloride: 100 mmol/L (ref 96–106)
Creatinine, Ser: 0.98 mg/dL (ref 0.57–1.00)
GFR calc Af Amer: 60 mL/min/{1.73_m2} (ref >59)
GFR calc non Af Amer: 52 mL/min/{1.73_m2} — ABNORMAL LOW (ref >59)
Glucose: 93 mg/dL (ref 65–99)
Potassium: 4.1 mmol/L (ref 3.5–5.2)
Sodium: 141 mmol/L (ref 134–144)

## 2020-08-12 NOTE — Assessment & Plan Note (Addendum)
Patient presents with a 3-week history of right wrist pain at the base of her thumb.  The patient son accompanies her to this appointment and helps translate for her.  She states that she has increased pain with movement of the right wrist.  She does not currently work, denies any recent trauma to her wrist, denies any repetitive actions or tasks, and denies any pain in separate joints.  Patient son states that he recently got her rolling walker within the last several months and has been using that to ambulate around the house.  She often has to use the walker to stabilize herself and push up against when standing.   On physical exam the patient has pain to palpation at the base of the right thumb with minimal swelling over the dorsal compartment of the radial styloid.  There is no surrounding erythema or rash.  Patient has 2+ radial pulse compared with her left radial pulse.  Patient has intact sensation in all dermatomes and 5 out of 5 strength with flexion extension and fine movements of her fingers.  She does have pain with movement during strength testing.  Particularly when gripping objects.  Phalen's/Tinel's test were negative for median nerve entrapment.  Patient has a positive Finkelstein maneuver on the right with passive and active stretch of the thumb tendon.   I have a high index of suspicion that she is suffering from de Quervain's tendinitis.  She denies any recent trauma, but I will get a wrist x-ray to rule out intra-articular pathology or traumatic fractures including scaphoid fracture.   I counseled her on activity modifications and use of thumb spica for thumb immobilization.  I will prescribe her a short course of nonsteroidal anti-inflammatory medications for pain and inflammation.  I will refer her to physical therapy pending results of her wrist x-ray.  Plan: -Wrist x-ray -Mobic 7.5 mg daily for 7 days -Thumb spica -Physical therapy pending results of x-ray

## 2020-08-13 NOTE — Progress Notes (Signed)
Internal Medicine Clinic Attending  Case discussed with Dr. Coe  At the time of the visit.  We reviewed the resident's history and exam and pertinent patient test results.  I agree with the assessment, diagnosis, and plan of care documented in the resident's note.  

## 2020-08-14 ENCOUNTER — Other Ambulatory Visit: Payer: Self-pay | Admitting: Internal Medicine

## 2020-08-14 DIAGNOSIS — M654 Radial styloid tenosynovitis [de Quervain]: Secondary | ICD-10-CM

## 2020-08-14 NOTE — Progress Notes (Signed)
  Reason for call:   I placed an outgoing call to [name]'s son at 10:43 AM regarding patient's lab results and wrist x-ray.  Informed patient's son regarding the results.    Assessment/ Plan:   I will put in a referral for physical therapy.  As always, pt is advised that if symptoms worsen or new symptoms arise, they should go to an urgent care facility or to to ER for further evaluation.   Dellia Cloud, MD   08/14/2020, 10:43 AM

## 2020-08-20 ENCOUNTER — Encounter: Payer: Self-pay | Admitting: Internal Medicine

## 2020-09-10 ENCOUNTER — Encounter: Payer: Self-pay | Admitting: Nurse Practitioner

## 2020-09-10 NOTE — Progress Notes (Deleted)
Electrophysiology Office Note Date: 09/10/2020  ID:  Makayla Atkins, DOB Dec 08, 1929, MRN [id number]  PCP: Dolan Amen, MD Electrophysiologist: Johney Frame  CC: Pacemaker follow-up  Makayla Atkins is a 86 y.o. female seen today for Dr Johney Frame.  She presents today for routine electrophysiology followup.  Since last being seen in our clinic, the patient reports doing very well.  She denies chest pain, palpitations, dyspnea, PND, orthopnea, nausea, vomiting, dizziness, syncope, edema, weight gain, or early satiety.  Device History: STJ dual chamber PPM implanted 2020 for Mobitz II    Past Medical History:  Diagnosis Date  . Glaucoma   . Hypertension    Past Surgical History:  Procedure Laterality Date  . PACEMAKER IMPLANT N/A 08/31/2019   Procedure: PACEMAKER IMPLANT;  Surgeon: Hillis Range, MD;  [facility]: [facility];  Service: Cardiovascular;  Laterality: N/A;    Current Outpatient Medications  Medication Sig Dispense Refill  . acetaminophen (TYLENOL) 325 MG tablet Take 650 mg by mouth every 6 (six) hours as needed for mild pain.    Marland Kitchen amLODipine (NORVASC) 5 MG tablet Take 1 tablet (5 mg total) by mouth daily. 90 tablet 1  . Ascorbic Acid (VITAMIN C) 100 MG tablet Take 100 mg by mouth daily.    Marland Kitchen atorvastatin (LIPITOR) 40 MG tablet Take 1 tablet (40 mg total) by mouth daily. 90 tablet 1  . calcium-vitamin D (OSCAL WITH D) 250-125 MG-UNIT tablet Take 1 tablet by mouth daily.    . cholecalciferol (VITAMIN D3) 25 MCG (1000 UT) tablet Take 1,000 Units by mouth daily.    . Coenzyme Q10 100 MG TABS Take 30 mg by mouth daily.    . diclofenac sodium (VOLTAREN) 1 % GEL Apply 2 g topically 4 (four) times daily. 100 g 1  . docusate sodium (COLACE) 100 MG capsule Take 100 mg by mouth daily.    . Ferrous Gluconate (IRON 27 PO) Take 27 mg by mouth daily.    Marland Kitchen lisinopril (ZESTRIL) 40 MG tablet TAKE 1 TABLET BY MOUTH DAILY 30 tablet 5  . omeprazole (PRILOSEC) 20 MG capsule Take 1  capsule (20 mg total) by mouth daily. 30 capsule 1  . ondansetron (ZOFRAN) 4 MG tablet Take 1 tablet (4 mg total) by mouth every 8 (eight) hours as needed for nausea or vomiting. 20 tablet 0  . tetracaine (PONTOCAINE) 0.5 % ophthalmic solution Place 1 drop into both eyes daily.    . vitamin E 100 UNIT capsule Take 100 Units by mouth daily.    Carlena Hurl 20 MG TABS tablet TAKE 1 TABLET (20 MG TOTAL) BY MOUTH DAILY WITH SUPPER. 30 tablet 5   No current facility-administered medications for this visit.    Allergies:   Patient has no known allergies.   Social History: Social History   Socioeconomic History  . Marital status: Widowed    Spouse name: Not on file  . Number of children: Not on file  . Years of education: Not on file  . Highest education level: Not on file  Occupational History  . Not on file  Tobacco Use  . Smoking status: Never Smoker  . Smokeless tobacco: Never Used  Substance and Sexual Activity  . Alcohol use: Never  . Drug use: Never  . Sexual activity: Not on file  Other Topics Concern  . Not on file  Social History Narrative  . Not on file   Social Determinants of Health   Financial Resource Strain:   . Difficulty of  Paying Living Expenses: Not on file  Food Insecurity:   . Worried About Programme researcher, broadcasting/film/video in the Last Year: Not on file  . Ran Out of Food in the Last Year: Not on file  Transportation Needs:   . Lack of Transportation (Medical): Not on file  . Lack of Transportation (Non-Medical): Not on file  Physical Activity:   . Days of Exercise per Week: Not on file  . Minutes of Exercise per Session: Not on file  Stress:   . Feeling of Stress : Not on file  Social Connections:   . Frequency of Communication with Friends and Family: Not on file  . Frequency of Social Gatherings with Friends and Family: Not on file  . Attends Religious Services: Not on file  . Active Member of Clubs or Organizations: Not on file  . Attends Banker  Meetings: Not on file  . Marital Status: Not on file  Intimate Partner Violence:   . Fear of Current or Ex-Partner: Not on file  . Emotionally Abused: Not on file  . Physically Abused: Not on file  . Sexually Abused: Not on file    Family History: No family history on file.   Review of Systems: All other systems reviewed and are otherwise negative except as noted above.   Physical Exam: VS:  There were no vitals taken for this visit. , BMI There is no height or weight on file to calculate BMI.  GEN- The patient is well appearing, alert and oriented x 3 today.   HEENT: normocephalic, atraumatic; sclera clear, conjunctiva pink; hearing intact; oropharynx clear; neck supple  Lungs- Clear to ausculation bilaterally, normal work of breathing.  No wheezes, rales, rhonchi Heart- Regular rate and rhythm, no murmurs, rubs or gallops  GI- soft, non-tender, non-distended, bowel sounds present  Extremities- no clubbing, cyanosis, or edema  MS- no significant deformity or atrophy Skin- warm and dry, no rash or lesion; PPM pocket well healed Psych- euthymic mood, full affect Neuro- strength and sensation are intact  PPM Interrogation- reviewed in detail today,  See PACEART report  EKG:  EKG is not ordered today.  Recent Labs: 08/11/2020: BUN 13; Creatinine, Ser 0.98; Potassium 4.1; Sodium 141   Wt Readings from Last 3 Encounters:  08/11/20 164 lb 14.4 oz (74.8 kg)  12/06/19 170 lb (77.1 kg)  09/05/19 159 lb 14.4 oz (72.5 kg)     Other studies Reviewed: Additional studies/ records that were reviewed today include: Dr Jenel Lucks office notes   Assessment and Plan:  1.  Mobitz II Normal PPM function See Pace Art report No changes today Outputs reprogrammed today  2.  Persistent atypical atrial flutter Continue Xarelto for CHADS2VASC of 4 Persistent since device implant Device reprogrammed to VVIR today Recent BMET reviewed, CBC today  3.  HTN Stable No change required  today    Current medicines are reviewed at length with the patient today.   The patient does not have concerns regarding her medicines.  The following changes were made today:  none  Labs/ tests ordered today include: CBC No orders of the defined types were placed in this encounter.    Disposition:   Follow up with Arsenio Katz, Dr Johney Frame 1 year      Signed, Gypsy Balsam, NP 09/10/2020 5:54 AM  [facility] [address] [place] [address] [phone] (office) [phone] (fax)

## 2020-09-11 ENCOUNTER — Ambulatory Visit (INDEPENDENT_AMBULATORY_CARE_PROVIDER_SITE_OTHER): Payer: Self-pay

## 2020-09-11 DIAGNOSIS — I442 Atrioventricular block, complete: Secondary | ICD-10-CM

## 2020-09-11 LAB — CUP PACEART REMOTE DEVICE CHECK
Battery Remaining Longevity: 61 mo
Battery Remaining Percentage: 95.5 %
Battery Voltage: 2.98 V
Brady Statistic AP VP Percent: 67 %
Brady Statistic AP VS Percent: 1 %
Brady Statistic AS VP Percent: 32 %
Brady Statistic AS VS Percent: 1 %
Brady Statistic RA Percent Paced: 67 %
Brady Statistic RV Percent Paced: 99 %
Date Time Interrogation Session: 20211115051550
Implantable Lead Implant Date: 20201107
Implantable Lead Implant Date: 20201107
Implantable Lead Location: 753859
Implantable Lead Location: 753860
Implantable Pulse Generator Implant Date: 20201107
Lead Channel Impedance Value: 440 Ohm
Lead Channel Impedance Value: 530 Ohm
Lead Channel Pacing Threshold Amplitude: 0.5 V
Lead Channel Pacing Threshold Amplitude: 0.5 V
Lead Channel Pacing Threshold Pulse Width: 0.5 ms
Lead Channel Pacing Threshold Pulse Width: 0.5 ms
Lead Channel Sensing Intrinsic Amplitude: 12 mV
Lead Channel Sensing Intrinsic Amplitude: 4.3 mV
Lead Channel Setting Pacing Amplitude: 3.5 V
Lead Channel Setting Pacing Amplitude: 3.5 V
Lead Channel Setting Pacing Pulse Width: 0.5 ms
Lead Channel Setting Sensing Sensitivity: 4 mV
Pulse Gen Model: 2272
Pulse Gen Serial Number: 9175795

## 2020-09-11 MED FILL — LISINOPRIL 40 MG TABS: 40 | 30 days supply | Qty: 30 | Fill #2

## 2020-09-11 MED FILL — XARELTO 20 MG TABLET: 20 | 30 days supply | Qty: 30 | Fill #1

## 2020-09-14 NOTE — Progress Notes (Signed)
Remote pacemaker transmission.   

## 2020-09-22 NOTE — Addendum Note (Signed)
Addended by: Neomia Dear on: 09/22/2020 07:22 AM   Modules accepted: Orders

## 2020-10-01 NOTE — Progress Notes (Signed)
Cardiology Office Note Date:  10/02/2020  Patient ID:  Makayla Atkins, DOB [birth year], MRN [id number] PCP:  Dolan Amen, MD  Cardiologist/Electrophysiologist: Dr. Johney Frame     Chief Complaint: 20mo visit  History of Present Illness: Makayla Atkins is a 86 y.o. female with history of HTN, HLD, AFlutter, admitted with symptomatic bradycardia/advanced AV block Nov 2020 >> PPM  She comes in today to be seen for Dr. Johney Frame, last seen by him Feb 2021 via tele health visit, discussed need for clinic visit for chronic output settings and if still in AFlutter, condieration for VVIR programming.   TODAY She is accompanied by her son Makayla Atkins who translates. We reviewed the available languages on our interpretor, though he felt she would not be able to understand any of the close options. They are from [place], she speaks a dialect, Probation officer.  She is doing well. No CP, palpitations or cardiac awareness. No SOB, DOE She is mostly bothered by a chronic R wrist aching pain. No dizziness, near syncope or syncope. NO bleeding or signs of bleeding  Device information SJM dual chamber PPM implanted 08/31/2019   Past Medical History:  Diagnosis Date  . Glaucoma   . Hypertension     Past Surgical History:  Procedure Laterality Date  . PACEMAKER IMPLANT N/A 08/31/2019   Procedure: PACEMAKER IMPLANT;  Surgeon: Hillis Range, MD;  Location: [facility];  Service: Cardiovascular;  Laterality: N/A;    Current Outpatient Medications  Medication Sig Dispense Refill  . acetaminophen (TYLENOL) 325 MG tablet Take 650 mg by mouth every 6 (six) hours as needed for mild pain.    Marland Kitchen amLODipine (NORVASC) 5 MG tablet Take 1 tablet (5 mg total) by mouth daily. 90 tablet 1  . Ascorbic Acid (VITAMIN C) 100 MG tablet Take 100 mg by mouth daily.    Marland Kitchen atorvastatin (LIPITOR) 40 MG tablet Take 1 tablet (40 mg total) by mouth daily. 90 tablet 1  . calcium-vitamin D (OSCAL WITH D) 250-125 MG-UNIT tablet  Take 1 tablet by mouth daily.    . cholecalciferol (VITAMIN D3) 25 MCG (1000 UT) tablet Take 1,000 Units by mouth daily.    . Coenzyme Q10 100 MG TABS Take 30 mg by mouth daily. (Patient not taking: Reported on 10/02/2020)    . diclofenac sodium (VOLTAREN) 1 % GEL Apply 2 g topically 4 (four) times daily. 100 g 1  . docusate sodium (COLACE) 100 MG capsule Take 100 mg by mouth daily. (Patient not taking: Reported on 10/02/2020)    . Ferrous Gluconate (IRON 27 PO) Take 27 mg by mouth daily.    Marland Kitchen lisinopril (ZESTRIL) 40 MG tablet TAKE 1 TABLET BY MOUTH DAILY 30 tablet 5  . omeprazole (PRILOSEC) 20 MG capsule Take 1 capsule (20 mg total) by mouth daily. (Patient not taking: Reported on 10/02/2020) 30 capsule 1  . ondansetron (ZOFRAN) 4 MG tablet Take 1 tablet (4 mg total) by mouth every 8 (eight) hours as needed for nausea or vomiting. 20 tablet 0  . tetracaine (PONTOCAINE) 0.5 % ophthalmic solution Place 1 drop into both eyes daily.    . vitamin E 100 UNIT capsule Take 100 Units by mouth daily. (Patient not taking: Reported on 10/02/2020)    . XARELTO 20 MG TABS tablet TAKE 1 TABLET (20 MG TOTAL) BY MOUTH DAILY WITH SUPPER. 30 tablet 5   No current facility-administered medications for this visit.    Allergies:   Patient has no known allergies.   Social History:  The patient  reports that she has never smoked. She has never used smokeless tobacco. She reports that she does not drink alcohol and does not use drugs.   Family History:  No known particular family medical history  ROS:  Please see the history of present illness.    All other systems are reviewed and otherwise negative.   PHYSICAL EXAM:  VS:  BP 122/60   Pulse 63   Ht 5\' 4"  (1.626 m)   Wt 162 lb (73.5 kg)   SpO2 96%   BMI 27.81 kg/m  BMI: Body mass index is 27.81 kg/m. Well nourished, well developed, in no acute distress HEENT: normocephalic, atraumatic Neck: no JVD, carotid bruits or masses Cardiac:  RRR; no significant  murmurs, no rubs, or gallops Lungs:   CTA b/l, no wheezing, rhonchi or rales Abd: soft, nontender MS: no deformity or atrophy Ext: no edema Skin: warm and dry, no rash Neuro:  No gross deficits appreciated Psych: euthymic mood, full affect  PPM site is stable, no tethering or discomfort   EKG:  Done today and reviewed by myself shows  AV paced  Device interrogation done today and reviewed by myself:  Battery and lead measurements are good AMS episodes noted All available EGM were reviewed, majority were PACs, or AT/VP Longest AT was one hour HVR was 1;1, 9 seconds Chronic lead outputs programmed  08/30/2019: TTE IMPRESSIONS  1. Left ventricular ejection fraction, by visual estimation, is 60 to  65%. The left ventricle has normal function. There is severely increased  left ventricular hypertrophy.  2. Asymmetric basal septal hypertrophy measuring up to 82mm (47mm in  lateral wall)  3. Average global longitudinal strain is -15.4%  4. Global right ventricle has normal systolic function.The right  ventricular size is normal. No increase in right ventricular wall  thickness.  5. Severe mitral annular calcification.  6. The mitral valve is abnormal. Trace mitral valve regurgitation.  7. The tricuspid valve is normal in structure. Tricuspid valve  regurgitation is mild.  8. The aortic valve was not well visualized. Aortic valve regurgitation  is not visualized.  9. Severely elevated pulmonary artery systolic pressure.  10. Left atrial size was moderately dilated.  11. Right atrial size was normal.  12. Trivial pericardial effusion is present.  13. The inferior vena cava is dilated in size with <50% respiratory  variability, suggesting right atrial pressure of 15 mmHg.  14. The tricuspid regurgitant velocity is 4.02 m/s, and with an assumed  right atrial pressure of 15 mmHg, the estimated right ventricular systolic  pressure is severely elevated at 79.6 mmHg.      Recent Labs: 08/11/2020: BUN 13; Creatinine, Ser 0.98; Potassium 4.1; Sodium 141  No results found for requested labs within last 8760 hours.   CrCl cannot be calculated (Patient's most recent lab result is older than the maximum 21 days allowed.).   Wt Readings from Last 3 Encounters:  10/02/20 162 lb (73.5 kg)  08/11/20 164 lb 14.4 oz (74.8 kg)  12/06/19 170 lb (77.1 kg)     Other studies reviewed: Additional studies/records reviewed today include: summarized above  ASSESSMENT AND PLAN:  1. PPM     Intact function     Chronic lead outputs programmed  2. AFlutter 3. Atach     CHA2DS2Vasc is 4, on Xareto     Calc CrCl ranges 43-51, no changes for now, follow     Due for CBC  4. HTN    Looks good  Disposition: F/u with Q51mo remotes, in clinic in 34mo, sooner if needed  Current medicines are reviewed at length with the patient today.  The patient did not have any concerns regarding medicines.  Norma Fredrickson, PA-C 10/02/2020 4:36 PM     [facility] [address] [place] [address] [phone] (office)  [phone] (fax)

## 2020-10-02 ENCOUNTER — Ambulatory Visit (INDEPENDENT_AMBULATORY_CARE_PROVIDER_SITE_OTHER): Payer: Self-pay | Admitting: Physician Assistant

## 2020-10-02 ENCOUNTER — Other Ambulatory Visit: Payer: Self-pay

## 2020-10-02 ENCOUNTER — Encounter: Payer: Self-pay | Admitting: Physician Assistant

## 2020-10-02 VITALS — BP 122/60 | HR 63 | Ht 64.0 in | Wt 162.0 lb

## 2020-10-02 DIAGNOSIS — I442 Atrioventricular block, complete: Secondary | ICD-10-CM

## 2020-10-02 DIAGNOSIS — Z950 Presence of cardiac pacemaker: Secondary | ICD-10-CM

## 2020-10-02 DIAGNOSIS — I10 Essential (primary) hypertension: Secondary | ICD-10-CM

## 2020-10-02 DIAGNOSIS — I4892 Unspecified atrial flutter: Secondary | ICD-10-CM

## 2020-10-02 DIAGNOSIS — I471 Supraventricular tachycardia: Secondary | ICD-10-CM

## 2020-10-02 NOTE — Patient Instructions (Signed)
Medication Instructions:    Your physician recommends that you continue on your current medications as directed. Please refer to the Current Medication list given to you today.  *If you need a refill on your cardiac medications before your next appointment, please call your pharmacy*   Lab Work: CBC  TODAY   If you have labs (blood work) drawn today and your tests are completely normal, you will receive your results only by: Marland Kitchen MyChart Message (if you have MyChart) OR . A paper copy in the mail If you have any lab test that is abnormal or we need to change your treatment, we will call you to review the results.   Testing/Procedures: NONE ORDERED  TODAY     Follow-Up: At [facility], you and your health needs are our priority.  As part of our continuing mission to provide you with exceptional heart care, we have created designated Provider Care Teams.  These Care Teams include your primary Cardiologist (physician) and Advanced Practice Providers (APPs -  Physician Assistants and Nurse Practitioners) who all work together to provide you with the care you need, when you need it.  We recommend signing up for the patient portal called "MyChart".  Sign up information is provided on this After Visit Summary.  MyChart is used to connect with patients for Virtual Visits (Telemedicine).  Patients are able to view lab/test results, encounter notes, upcoming appointments, etc.  Non-urgent messages can be sent to your provider as well.   To learn more about what you can do with MyChart, go to [email or web address].    Your next appointment:   6 month(s)  The format for your next appointment:   In Person  Provider:   You may see Hillis Range, MD or one of the following Advanced Practice Providers on your designated Care Team:    Gypsy Balsam, NP  Francis Dowse, PA-C  Casimiro Needle "Otilio Saber, [place]    Other Instructions

## 2020-10-03 LAB — CBC
Hematocrit: 36.9 % (ref 34.0–46.6)
Hemoglobin: 12.5 g/dL (ref 11.1–15.9)
MCH: 28.3 pg (ref 26.6–33.0)
MCHC: 33.9 g/dL (ref 31.5–35.7)
MCV: 84 fL (ref 79–97)
Platelets: 141 10*3/uL — ABNORMAL LOW (ref 150–450)
RBC: 4.41 x10E6/uL (ref 3.77–5.28)
RDW: 12.9 % (ref 11.7–15.4)
WBC: 5.9 10*3/uL (ref 3.4–10.8)

## 2020-10-05 NOTE — Addendum Note (Signed)
Addended by: Kemper Hochman M on: 10/05/2020 01:05 PM   Modules accepted: Orders  

## 2020-10-09 IMAGING — DX DG CHEST 1V PORT
1 series · 1 of 1 positions shown · non-contrast
Comparison: June 2019

CLINICAL DATA: Chest pain

EXAM:
PORTABLE CHEST 1 VIEW

[chest ap]
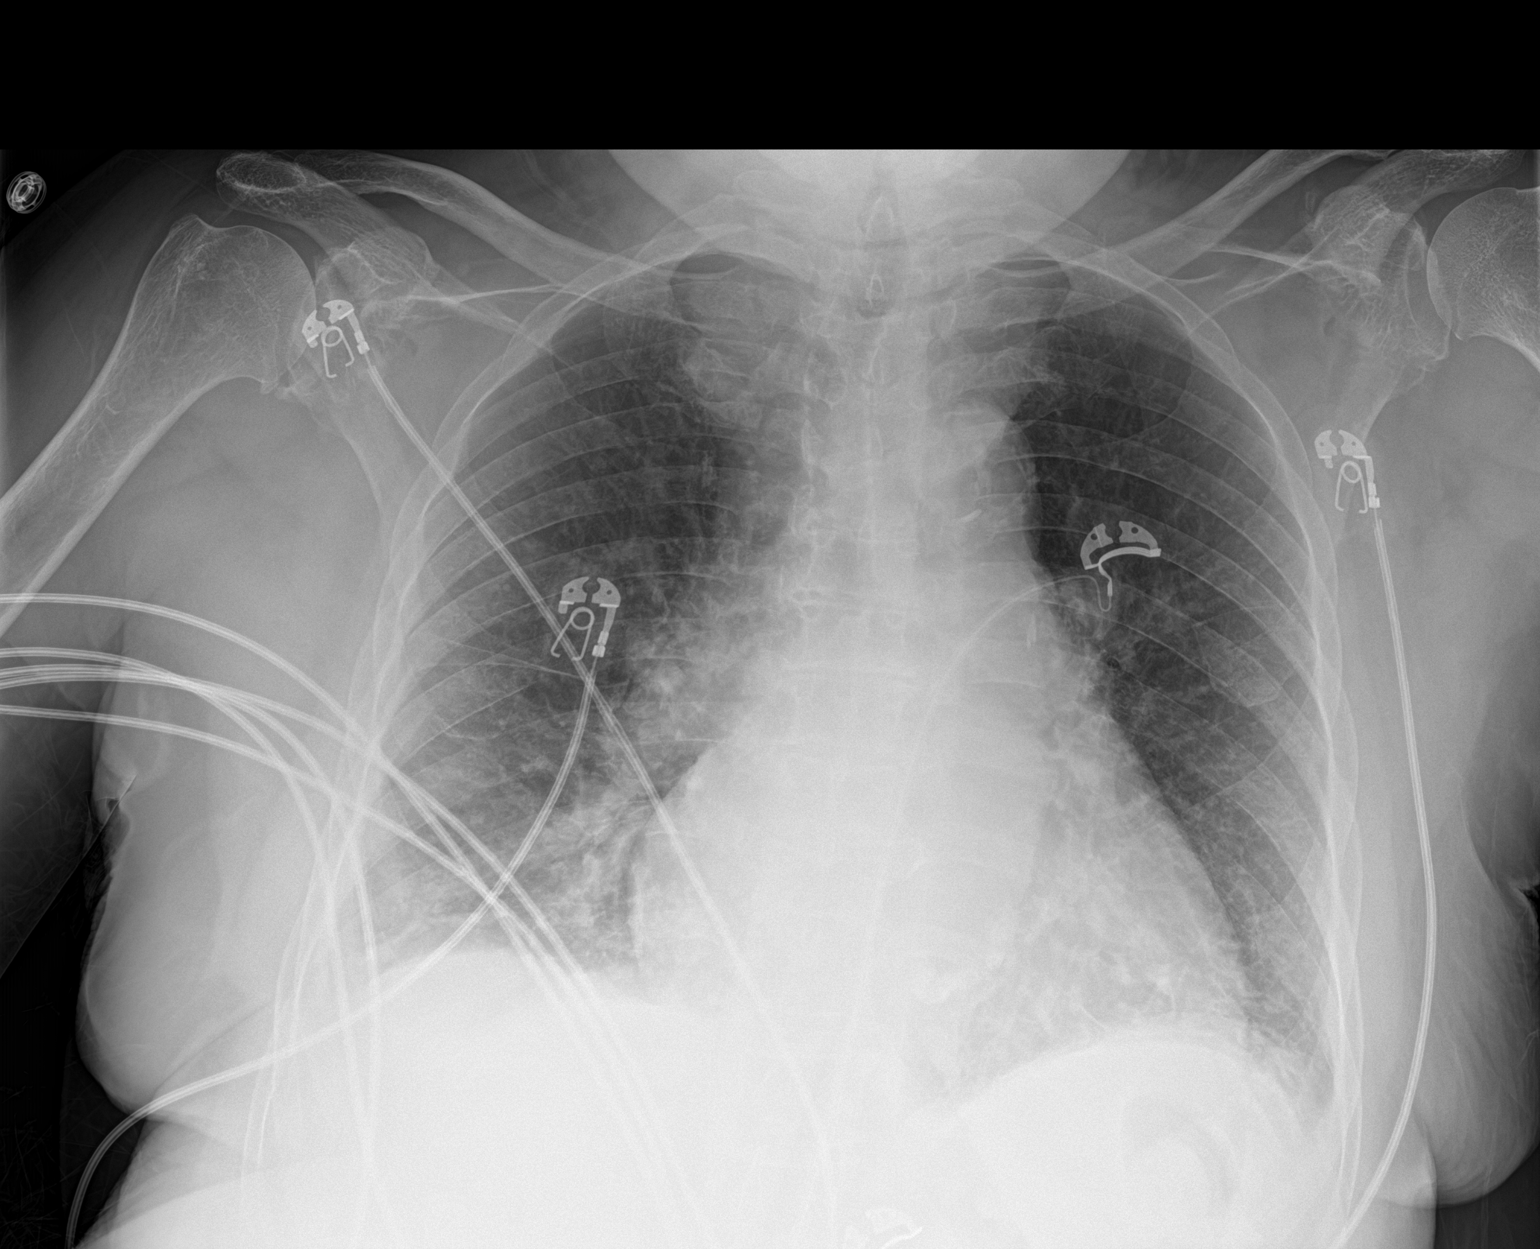

[1 of 1 positions shown; findings below may reference images not displayed]

FINDINGS: Mild bibasilar atelectasis. Possible trace right pleural effusion.
No pneumothorax. Top normal heart size. Mild calcified plaque along
the thoracic aorta.
IMPRESSION: Mild bibasilar atelectasis and possible trace right effusion.

## 2020-10-11 IMAGING — CR DG CHEST 2V
2 series · 2 of 2 positions shown · non-contrast
Comparison: 08/30/2019

CLINICAL DATA: Cardiac device in situ.

EXAM:
CHEST - 2 VIEW

[chest lat]
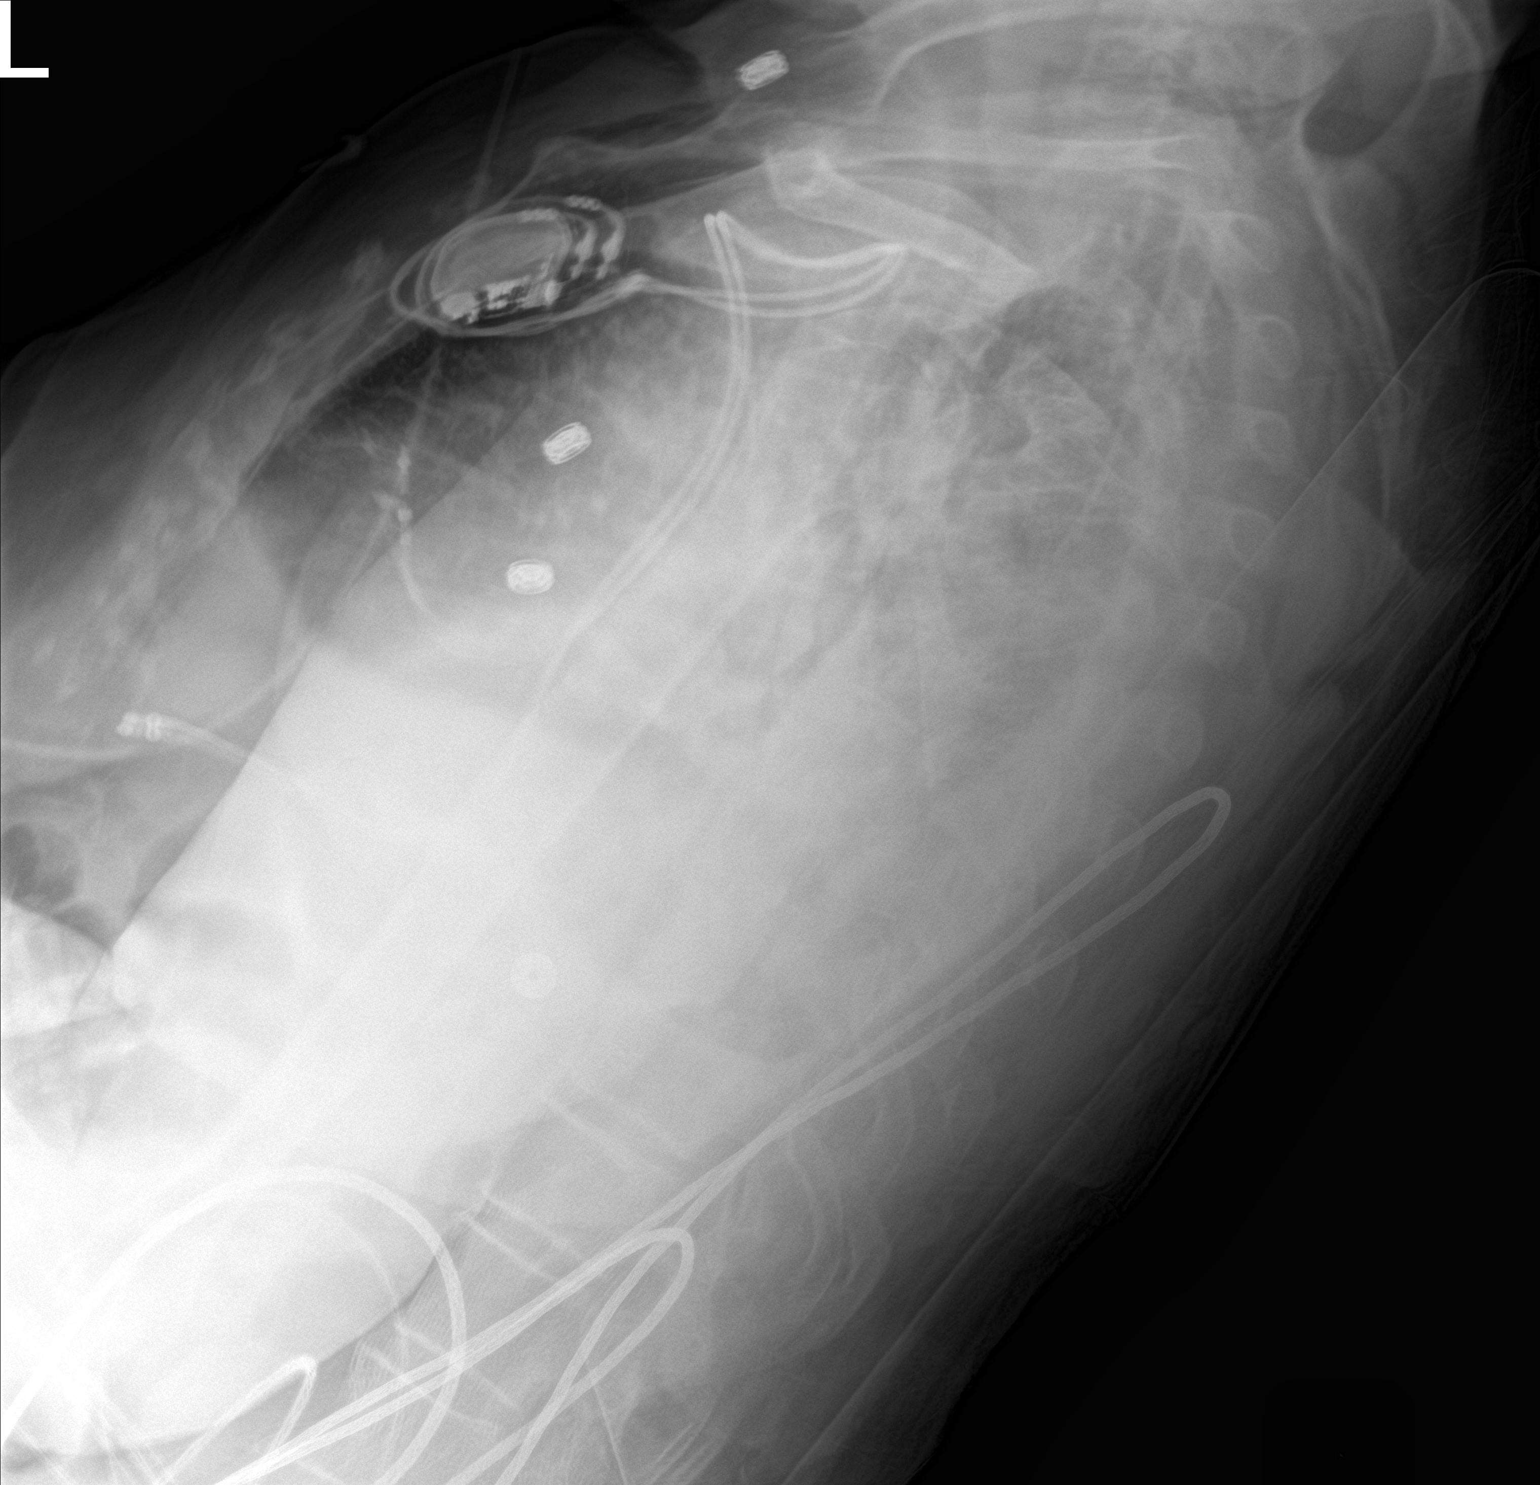

[chest ap]
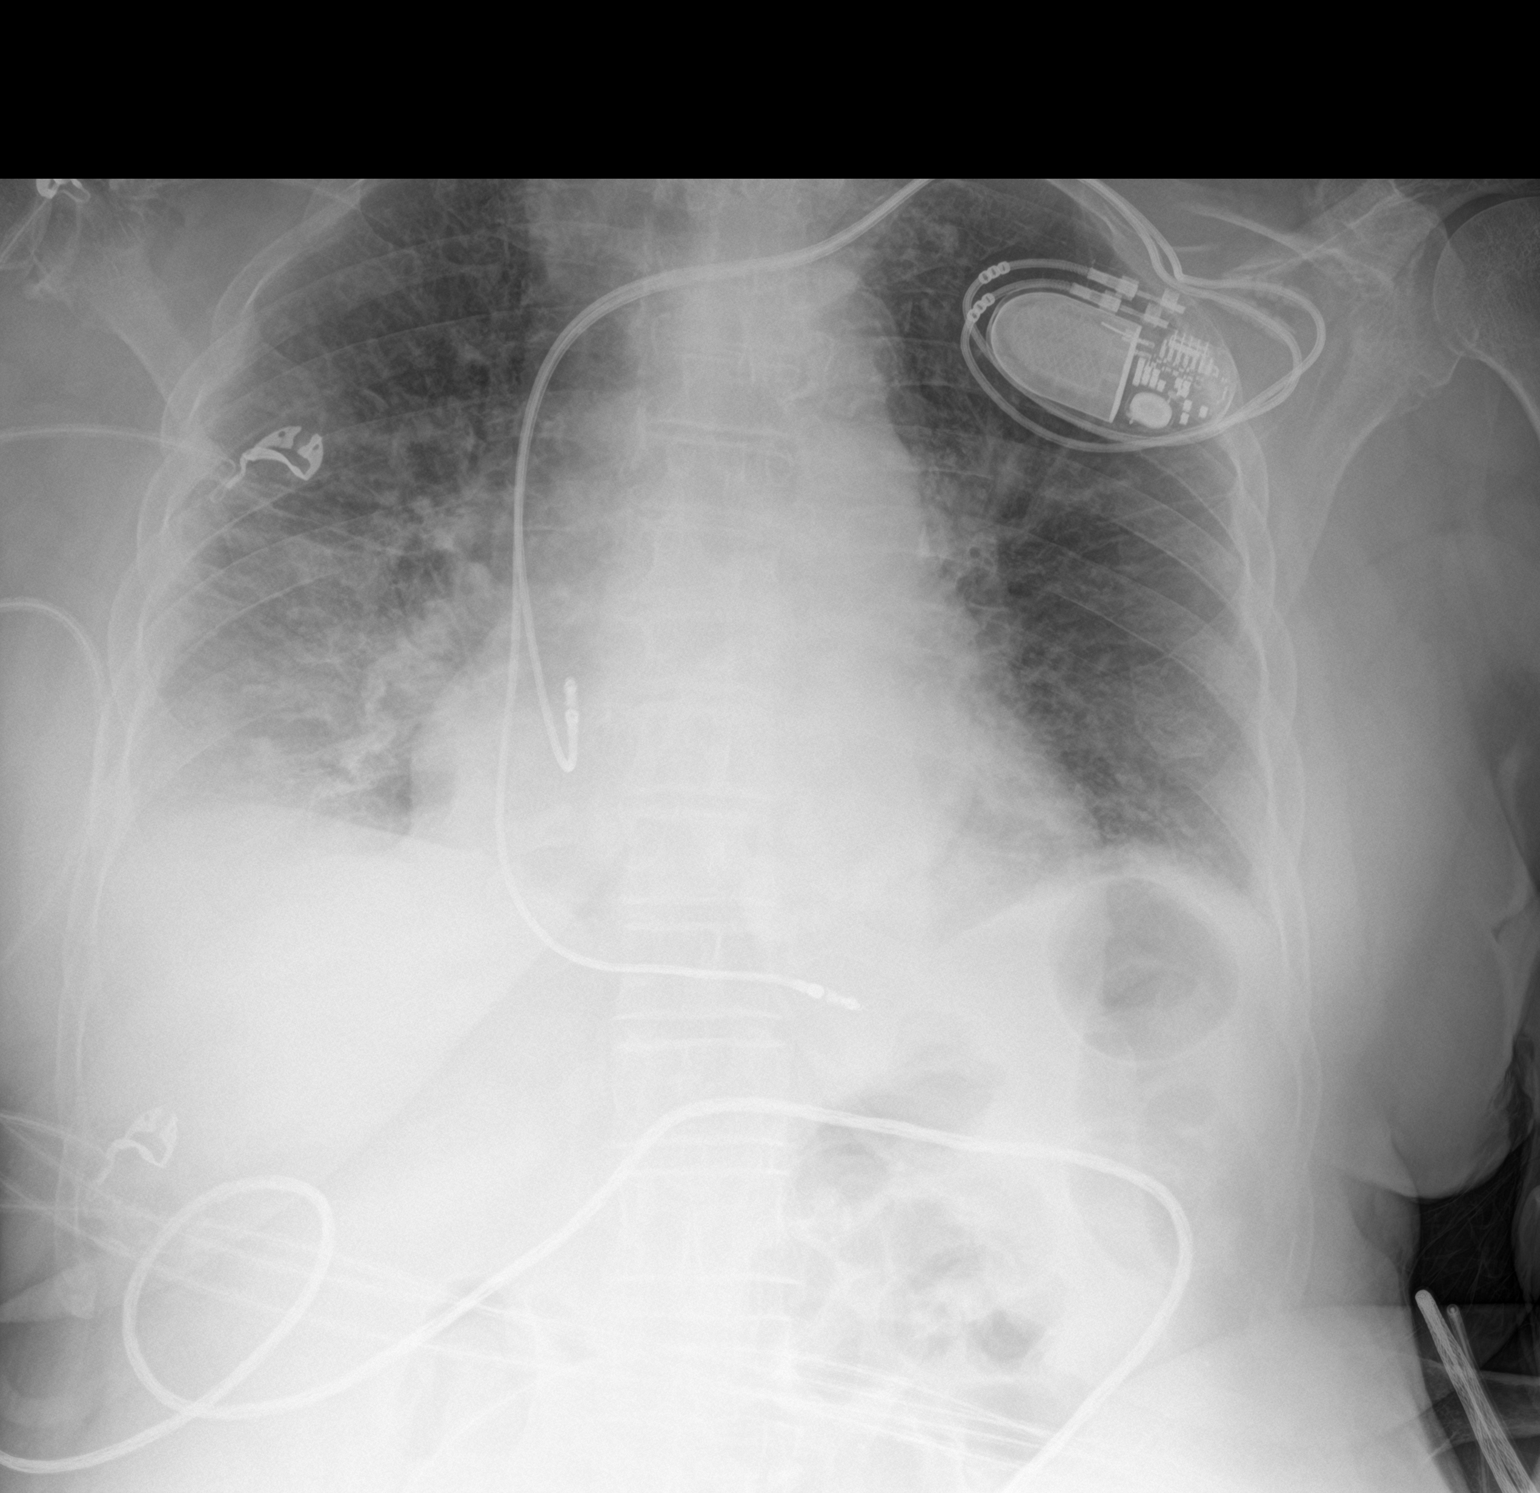

[2 of 2 positions shown; findings below may reference images not displayed]

FINDINGS: Interval placement of dual lead left-sided pacemaker intact. Lungs
are adequately inflated demonstrate hazy prominence of the perihilar
vessels likely mild interstitial edema as these findings are worse.
Possible small amount right pleural fluid. No pneumothorax. Mild
stable cardiomegaly. Remainder of the exam is unchanged.
IMPRESSION: 1. Worsening hazy prominence of the perihilar markings suggesting
interstitial edema. Possible small amount right pleural fluid.

2.  Mild stable cardiomegaly.

3.  Placement of left-sided pacemaker intact.  No pneumothorax.

## 2020-10-19 MED FILL — XARELTO 20 MG TABLET: 20 | 30 days supply | Qty: 30 | Fill #2

## 2020-10-19 MED FILL — LISINOPRIL 40 MG TABS: 40 | 30 days supply | Qty: 30 | Fill #3

## 2020-11-20 ENCOUNTER — Inpatient Hospital Stay (HOSPITAL_COMMUNITY)
Admission: EM | Admit: 2020-11-20 | Discharge: 2020-11-22 | DRG: 177 | Disposition: A | Discharge status: Home Health | Payer: HRSA Program | Attending: Internal Medicine | Admitting: Internal Medicine

## 2020-11-20 ENCOUNTER — Emergency Department (HOSPITAL_COMMUNITY): Payer: HRSA Program

## 2020-11-20 ENCOUNTER — Encounter (HOSPITAL_COMMUNITY): Payer: Self-pay | Admitting: Emergency Medicine

## 2020-11-20 DIAGNOSIS — H409 Unspecified glaucoma: Secondary | ICD-10-CM | POA: Diagnosis present

## 2020-11-20 DIAGNOSIS — E785 Hyperlipidemia, unspecified: Secondary | ICD-10-CM | POA: Diagnosis present

## 2020-11-20 DIAGNOSIS — N179 Acute kidney failure, unspecified: Secondary | ICD-10-CM | POA: Diagnosis present

## 2020-11-20 DIAGNOSIS — Z7901 Long term (current) use of anticoagulants: Secondary | ICD-10-CM

## 2020-11-20 DIAGNOSIS — I4892 Unspecified atrial flutter: Secondary | ICD-10-CM | POA: Diagnosis present

## 2020-11-20 DIAGNOSIS — G9340 Encephalopathy, unspecified: Secondary | ICD-10-CM | POA: Diagnosis present

## 2020-11-20 DIAGNOSIS — I10 Essential (primary) hypertension: Secondary | ICD-10-CM | POA: Diagnosis present

## 2020-11-20 DIAGNOSIS — E669 Obesity, unspecified: Secondary | ICD-10-CM | POA: Diagnosis present

## 2020-11-20 DIAGNOSIS — R0602 Shortness of breath: Secondary | ICD-10-CM

## 2020-11-20 DIAGNOSIS — Z6826 Body mass index (BMI) 26.0-26.9, adult: Secondary | ICD-10-CM

## 2020-11-20 DIAGNOSIS — J1282 Pneumonia due to coronavirus disease 2019: Secondary | ICD-10-CM | POA: Diagnosis present

## 2020-11-20 DIAGNOSIS — U071 COVID-19: Secondary | ICD-10-CM | POA: Diagnosis not present

## 2020-11-20 DIAGNOSIS — Z79899 Other long term (current) drug therapy: Secondary | ICD-10-CM

## 2020-11-20 DIAGNOSIS — I442 Atrioventricular block, complete: Secondary | ICD-10-CM | POA: Diagnosis present

## 2020-11-20 DIAGNOSIS — R627 Adult failure to thrive: Secondary | ICD-10-CM | POA: Diagnosis present

## 2020-11-20 DIAGNOSIS — Z950 Presence of cardiac pacemaker: Secondary | ICD-10-CM

## 2020-11-20 DIAGNOSIS — R0902 Hypoxemia: Secondary | ICD-10-CM | POA: Diagnosis present

## 2020-11-20 LAB — COMPREHENSIVE METABOLIC PANEL
ALT: 46 U/L — ABNORMAL HIGH (ref 0–44)
AST: 96 U/L — ABNORMAL HIGH (ref 15–41)
Albumin: 3.6 g/dL (ref 3.5–5.0)
Alkaline Phosphatase: 46 U/L (ref 38–126)
Anion gap: 11 (ref 5–15)
BUN: 26 mg/dL — ABNORMAL HIGH (ref 8–23)
CO2: 26 mmol/L (ref 22–32)
Calcium: 8.7 mg/dL — ABNORMAL LOW (ref 8.9–10.3)
Chloride: 103 mmol/L (ref 98–111)
Creatinine, Ser: 1.42 mg/dL — ABNORMAL HIGH (ref 0.44–1.00)
GFR, Estimated: 36 mL/min — ABNORMAL LOW (ref >60)
Glucose, Bld: 128 mg/dL — ABNORMAL HIGH (ref 70–99)
Potassium: 3.7 mmol/L (ref 3.5–5.1)
Sodium: 140 mmol/L (ref 135–145)
Total Bilirubin: 1.5 mg/dL — ABNORMAL HIGH (ref 0.3–1.2)
Total Protein: 7.2 g/dL (ref 6.5–8.1)

## 2020-11-20 LAB — D-DIMER, QUANTITATIVE: D-Dimer, Quant: 1.25 ug/mL-FEU — ABNORMAL HIGH (ref 0.00–0.50)

## 2020-11-20 LAB — FIBRINOGEN: Fibrinogen: 530 mg/dL — ABNORMAL HIGH (ref 210–475)

## 2020-11-20 LAB — CBC WITH DIFFERENTIAL/PLATELET
Abs Immature Granulocytes: 0 10*3/uL (ref 0.00–0.07)
Basophils Absolute: 0 10*3/uL (ref 0.0–0.1)
Basophils Relative: 0 %
Eosinophils Absolute: 0 10*3/uL (ref 0.0–0.5)
Eosinophils Relative: 0 %
HCT: 37 % (ref 36.0–46.0)
Hemoglobin: 12.5 g/dL (ref 12.0–15.0)
Lymphocytes Relative: 16 %
Lymphs Abs: 0.9 10*3/uL (ref 0.7–4.0)
MCH: 29.9 pg (ref 26.0–34.0)
MCHC: 33.8 g/dL (ref 30.0–36.0)
MCV: 88.5 fL (ref 80.0–100.0)
Monocytes Absolute: 0.1 10*3/uL (ref 0.1–1.0)
Monocytes Relative: 2 %
Neutro Abs: 4.8 10*3/uL (ref 1.7–7.7)
Neutrophils Relative %: 82 %
Platelets: 160 10*3/uL (ref 150–400)
RBC: 4.18 MIL/uL (ref 3.87–5.11)
RDW: 13.3 % (ref 11.5–15.5)
WBC: 5.9 10*3/uL (ref 4.0–10.5)
nRBC: 0 % (ref 0.0–0.2)
nRBC: 0 /100 WBC

## 2020-11-20 LAB — C-REACTIVE PROTEIN: CRP: 10.5 mg/dL — ABNORMAL HIGH (ref <1.0)

## 2020-11-20 LAB — LACTIC ACID, PLASMA
Lactic Acid, Venous: 1.7 mmol/L (ref 0.5–1.9)
Lactic Acid, Venous: 1.3 mmol/L (ref 0.5–1.9)

## 2020-11-20 LAB — FERRITIN: Ferritin: 1382 ng/mL — ABNORMAL HIGH (ref 11–307)

## 2020-11-20 LAB — TRIGLYCERIDES: Triglycerides: 151 mg/dL — ABNORMAL HIGH (ref <150)

## 2020-11-20 LAB — PROCALCITONIN: Procalcitonin: <0.1 ng/mL

## 2020-11-20 LAB — LACTATE DEHYDROGENASE: LDH: 354 U/L — ABNORMAL HIGH (ref 98–192)

## 2020-11-20 LAB — POC SARS CORONAVIRUS 2 AG -  ED: SARS Coronavirus 2 Ag: POSITIVE — AB

## 2020-11-20 MED ORDER — POLYETHYLENE GLYCOL 3350 17 G PO PACK: 17.0000 g | PACK | Freq: Every day | ORAL | Status: DC | PRN

## 2020-11-20 MED ORDER — AMLODIPINE BESYLATE 5 MG PO TABS: 5.0000 mg | ORAL_TABLET | Freq: Every day | ORAL | Status: DC

## 2020-11-20 MED ORDER — RIVAROXABAN 15 MG PO TABS
15.0000 mg | ORAL_TABLET | Freq: Every day | ORAL | Status: DC
  Administered 2020-11-21 – 2020-11-22 (×2): 15 mg via ORAL
  Filled 2020-11-20 (×2): qty 1

## 2020-11-20 MED ORDER — DEXAMETHASONE SODIUM PHOSPHATE 10 MG/ML IJ SOLN
6.0000 mg | Freq: Once | INTRAMUSCULAR | Status: AC
  Administered 2020-11-20: 6 mg via INTRAVENOUS
  Filled 2020-11-20: qty 1

## 2020-11-20 MED ORDER — LACTATED RINGERS IV SOLN: INTRAVENOUS | Status: DC

## 2020-11-20 MED ORDER — DEXAMETHASONE 6 MG PO TABS
6.0000 mg | ORAL_TABLET | ORAL | Status: DC
  Administered 2020-11-21 – 2020-11-22 (×2): 6 mg via ORAL
  Filled 2020-11-20: qty 1
  Filled 2020-11-20: qty 2

## 2020-11-20 MED ORDER — LACTATED RINGERS IV BOLUS
1000.0000 mL | Freq: Once | INTRAVENOUS | Status: AC
  Administered 2020-11-20: 1000 mL via INTRAVENOUS

## 2020-11-20 MED ORDER — ENOXAPARIN SODIUM 40 MG/0.4ML ~~LOC~~ SOLN: 40.0000 mg | SUBCUTANEOUS | Status: DC

## 2020-11-20 MED ORDER — ADULT MULTIVITAMIN W/MINERALS CH
1.0000 | ORAL_TABLET | Freq: Every day | ORAL | Status: DC
  Administered 2020-11-21 – 2020-11-22 (×2): 1 via ORAL
  Filled 2020-11-20 (×2): qty 1

## 2020-11-20 MED ORDER — ACETAMINOPHEN 325 MG PO TABS: 650.0000 mg | ORAL_TABLET | Freq: Four times a day (QID) | ORAL | Status: DC | PRN

## 2020-11-20 MED ORDER — LACTATED RINGERS IV SOLN: INTRAVENOUS | Status: AC

## 2020-11-20 NOTE — ED Provider Notes (Signed)
I saw and evaluated the patient, reviewed the resident's note and I agree with the findings and plan.  Pertinent History: This patient is a 87 year old female living at home with her son and his wife and children.  There were some family members with a febrile illness last week.  The patient over the last several days has had failure to thrive, not wanting to eat or drink, becoming more more weak.  She is not complaining of pain, he states that she just stares off and is not her normal self, will not interact with others, is able to get around but requires significant assistance which is not normal for her.  She does have a history of hypertension, no prior strokes or heart attacks, has been in [place] for several years, she does not speak any [place], she is from [place], he interprets  Pertinent Exam findings: The patient has a soft nontender abdomen, heart sounds are normal, heart rate is normal, she has rales at the bases, mild tachypnea and oxygen of 90% on room air, no edema.    Covid positive, needs O2 - likely admit.  Patient is requiring oxygen, hypoxic respiratory failure secondary to Covid  .Critical Care Performed by: Eber Hong, MD Authorized by: Eber Hong, MD   Critical care provider statement:    Critical care time (minutes):  35   Critical care time was exclusive of:  Separately billable procedures and treating other patients and teaching time   Critical care was necessary to treat or prevent imminent or life-threatening deterioration of the following conditions:  Respiratory failure   Critical care was time spent personally by me on the following activities:  Blood draw for specimens, development of treatment plan with patient or surrogate, discussions with consultants, evaluation of patient's response to treatment, examination of patient, obtaining history from patient or surrogate, ordering and performing treatments and interventions, ordering and review of laboratory  studies, ordering and review of radiographic studies, pulse oximetry, re-evaluation of patient's condition and review of old charts Comments:         I was personally present and directly supervised the following procedures:  Medical evaluation  I personally interpreted the EKG as well as the resident and agree with the interpretation on the resident's chart.   EKG Interpretation  Date/Time:  Friday November 20 2020 18:37:28 EST Ventricular Rate:  87 PR Interval:  144 QRS Duration: 78 QT Interval:  376 QTC Calculation: 452 R Axis:   63 Text Interpretation: Atrial-sensed ventricular-paced rhythm Abnormal ECG since last tracing, QRS more narrow Confirmed by Eber Hong ([id number]) on 11/20/2020 7:51:47 PM       Final diagnoses:  COVID-19  Respiratory failure due to hypoxia    Eber Hong, MD 11/22/20 1457

## 2020-11-20 NOTE — ED Triage Notes (Signed)
Patient brought in by son for altered mental status. Patient normally is able to care for herself but over the last week has slowly become less alert and sleeps all day.

## 2020-11-20 NOTE — ED Provider Notes (Signed)
[facility] EMERGENCY DEPARTMENT Provider Note   CSN: [id number] Arrival date & time: 11/20/20  1832     History Chief Complaint  Patient presents with  . Altered Mental Status    Makayla Atkins is a 87 y.o. female.  The history is provided by the patient (Son). The history is limited by a language barrier. A language interpreter was used.  URI Presenting symptoms: cough and fatigue   Presenting symptoms: no ear pain, no fever and no sore throat   Severity:  Moderate Onset quality:  Gradual Duration:  1 week Timing:  Constant Progression:  Worsening Chronicity:  New Relieved by:  Nothing Worsened by:  Nothing Ineffective treatments:  None tried Associated symptoms: no arthralgias   Risk factors: being elderly and sick contacts (Daughter in Social worker)        Past Medical History:  Diagnosis Date  . Glaucoma   . Hypertension     Patient Active Problem List   Diagnosis Date Noted  . COVID-19 virus infection 11/20/2020  . Tendinitis, de Quervain's 08/12/2020  . Complete heart block (HCC) 08/30/2019  . Hematuria 08/07/2019  . Arthritis of knee 08/07/2019  . Atrial flutter (HCC) 08/06/2019  . Hyperlipidemia 08/06/2019  . Hypertension 07/30/2019    Past Surgical History:  Procedure Laterality Date  . PACEMAKER IMPLANT N/A 08/31/2019   Procedure: PACEMAKER IMPLANT;  Surgeon: Hillis Range, MD;  Location: [facility] INVASIVE CV LAB;  Service: Cardiovascular;  Laterality: N/A;     OB History   No obstetric history on file.     Family History  Problem Relation Age of Onset  . Other Neg Hx        no known particular family medical history    Social History   Tobacco Use  . Smoking status: Never Smoker  . Smokeless tobacco: Never Used  Substance Use Topics  . Alcohol use: Never  . Drug use: Never    Home Medications Prior to Admission medications   Medication Sig Start Date End Date Taking? Authorizing Provider  acetaminophen (TYLENOL) 325 MG  tablet Take 650 mg by mouth every 6 (six) hours as needed for mild pain.   Yes [provider]  amLODipine (NORVASC) 5 MG tablet Take 1 tablet (5 mg total) by mouth daily. Patient taking differently: Take 5 mg by mouth daily after lunch. 06/02/20  Yes [name], Fayrene Fearing, MD  atorvastatin (LIPITOR) 40 MG tablet Take 1 tablet (40 mg total) by mouth daily. Patient taking differently: Take 40 mg by mouth daily after lunch. 06/02/20  Yes [name], Fayrene Fearing, MD  Cholecalciferol (VITAMIN D) 125 MCG (5000 UT) CAPS Take 5,000 Units by mouth daily after lunch.   Yes [provider]  lisinopril (ZESTRIL) 40 MG tablet TAKE 1 TABLET BY MOUTH DAILY Patient taking differently: Take 40 mg by mouth at bedtime. 07/03/20  Yes [name], Fayrene Fearing, MD  polyvinyl alcohol (LIQUIFILM TEARS) 1.4 % ophthalmic solution Place 1 drop into both eyes as needed for dry eyes.   Yes [provider]  XARELTO 20 MG TABS tablet TAKE 1 TABLET (20 MG TOTAL) BY MOUTH DAILY WITH SUPPER. Patient taking differently: Take 20 mg by mouth daily after lunch. 07/03/20  Yes AllredFayrene Fearing, MD    Allergies    Patient has no known allergies.  Review of Systems   Review of Systems  Constitutional: Positive for activity change, appetite change and fatigue. Negative for chills and fever.  HENT: Negative for ear pain and sore throat.   Eyes:  Negative for pain and visual disturbance.  Respiratory: Positive for cough and shortness of breath.   Cardiovascular: Negative for chest pain and palpitations.  Gastrointestinal: Positive for nausea. Negative for abdominal pain and vomiting.  Genitourinary: Negative for dysuria and hematuria.  Musculoskeletal: Negative for arthralgias and back pain.  Skin: Negative for color change and rash.  Neurological: Negative for seizures and syncope.  All other systems reviewed and are negative.   Physical Exam Updated Vital Signs BP 110/67   Pulse 68   Temp 99.2 F (37.3 C) (Oral)   Resp 13   SpO2  99%   Physical Exam Vitals and nursing note reviewed.  Constitutional:      General: She is not in acute distress.    Appearance: She is well-developed and well-nourished.  HENT:     Head: Normocephalic and atraumatic.  Eyes:     Conjunctiva/sclera: Conjunctivae normal.  Cardiovascular:     Rate and Rhythm: Normal rate and regular rhythm.     Heart sounds: No murmur heard.   Pulmonary:     Effort: Pulmonary effort is normal. No respiratory distress.     Breath sounds: Examination of the right-lower field reveals rales. Examination of the left-lower field reveals rales. Rales present.  Abdominal:     Palpations: Abdomen is soft.     Tenderness: There is no abdominal tenderness.  Musculoskeletal:        General: No edema.     Cervical back: Neck supple.  Skin:    General: Skin is warm and dry.  Neurological:     Mental Status: She is alert.  Psychiatric:        Mood and Affect: Mood and affect normal.     ED Results / Procedures / Treatments   Labs (all labs ordered are listed, but only abnormal results are displayed) Labs Reviewed  COMPREHENSIVE METABOLIC PANEL - Abnormal; Notable for the following components:      Result Value   Glucose, Bld 128 (*)    BUN 26 (*)    Creatinine, Ser 1.42 (*)    Calcium 8.7 (*)    AST 96 (*)    ALT 46 (*)    Total Bilirubin 1.5 (*)    GFR, Estimated 36 (*)    All other components within normal limits  D-DIMER, QUANTITATIVE (NOT AT [facility]) - Abnormal; Notable for the following components:   D-Dimer, Quant 1.25 (*)    All other components within normal limits  LACTATE DEHYDROGENASE - Abnormal; Notable for the following components:   LDH 354 (*)    All other components within normal limits  FERRITIN - Abnormal; Notable for the following components:   Ferritin 1,382 (*)    All other components within normal limits  TRIGLYCERIDES - Abnormal; Notable for the following components:   Triglycerides 151 (*)    All other components within  normal limits  FIBRINOGEN - Abnormal; Notable for the following components:   Fibrinogen 530 (*)    All other components within normal limits  C-REACTIVE PROTEIN - Abnormal; Notable for the following components:   CRP 10.5 (*)    All other components within normal limits  CBC WITH DIFFERENTIAL/PLATELET - Abnormal; Notable for the following components:   Hemoglobin 11.2 (*)    HCT 35.7 (*)    Platelets 148 (*)    All other components within normal limits  COMPREHENSIVE METABOLIC PANEL - Abnormal; Notable for the following components:   Glucose, Bld 139 (*)    BUN  25 (*)    Creatinine, Ser 1.14 (*)    Calcium 8.5 (*)    Albumin 3.1 (*)    AST 85 (*)    Total Bilirubin 1.4 (*)    GFR, Estimated 46 (*)    All other components within normal limits  C-REACTIVE PROTEIN - Abnormal; Notable for the following components:   CRP 9.2 (*)    All other components within normal limits  D-DIMER, QUANTITATIVE (NOT AT [facility]) - Abnormal; Notable for the following components:   D-Dimer, Quant 1.16 (*)    All other components within normal limits  FERRITIN - Abnormal; Notable for the following components:   Ferritin 1,250 (*)    All other components within normal limits  HEMOGLOBIN A1C - Abnormal; Notable for the following components:   Hgb A1c MFr Bld 6.2 (*)    All other components within normal limits  POC SARS CORONAVIRUS 2 AG -  ED - Abnormal; Notable for the following components:   SARS Coronavirus 2 Ag POSITIVE (*)    All other components within normal limits  CULTURE, BLOOD (ROUTINE X 2)  CULTURE, BLOOD (ROUTINE X 2)  LACTIC ACID, PLASMA  LACTIC ACID, PLASMA  CBC WITH DIFFERENTIAL/PLATELET  PROCALCITONIN  URINALYSIS, ROUTINE W REFLEX MICROSCOPIC  CBG MONITORING, ED    EKG EKG Interpretation  Date/Time:  Friday November 20 2020 18:37:28 EST Ventricular Rate:  87 PR Interval:  144 QRS Duration: 78 QT Interval:  376 QTC Calculation: 452 R Axis:   63 Text  Interpretation: Atrial-sensed ventricular-paced rhythm Abnormal ECG since last tracing, QRS more narrow Confirmed by Eber Hong ([id number]) on 11/20/2020 7:51:47 PM   Radiology DG Chest Port 1 View  Result Date: 11/20/2020 CLINICAL DATA:  Altered mental status EXAM: PORTABLE CHEST 1 VIEW COMPARISON:  09/01/2011 FINDINGS: Left pacer remains in place, unchanged. Left base atelectasis or infiltrate. No confluent opacity on the right. Heart is normal size. No effusions or acute bony abnormality. IMPRESSION: Left base opacity could reflect atelectasis or infiltrate. Electronically Signed   By: Charlett Nose M.D.   On: 11/20/2020 19:23    Procedures   Medications Ordered in ED Medications  dexamethasone (DECADRON) tablet 6 mg (has no administration in time range)  multivitamin with minerals tablet 1 tablet (has no administration in time range)  polyethylene glycol (MIRALAX / GLYCOLAX) packet 17 g (has no administration in time range)  acetaminophen (TYLENOL) tablet 650 mg (has no administration in time range)  Rivaroxaban (XARELTO) tablet 15 mg (has no administration in time range)  lactated ringers bolus 1,000 mL (0 mLs Intravenous Stopped 11/21/20 0343)    Followed by  lactated ringers infusion ( Intravenous Restarted 11/21/20 0342)  dexamethasone (DECADRON) injection 6 mg (6 mg Intravenous Given 11/20/20 2147)    ED Course  I have reviewed the triage vital signs and the nursing notes.  Pertinent labs & imaging results that were available during my care of the patient were reviewed by me and considered in my medical decision making (see chart for details).    MDM Rules/Calculators/A&P                          This is a 87 year old, [place] speaking ([place] origin), with a PMHx of atrial flutter, HTN, HLD, and complete heart block s/p pacemaker who presents to the ED for evaluation of one week of fatigue, decreased PO intake, cough. Son reports his wife was diagnosed with covid recently.  Patient  has received one vaccine. No reported fevers, abd pain, vomiting, diarrhea, headaches, or dysuria.  Exam shows febrile patient to 101.8, hemodynamically stable, no acute distress. Bibasilar rales, abd soft, no rashes/lesions, no leg swelling/tenderness.  Work-up significant for anemia (at baseline), mild hyperglycemia, elevated BUN and Cr c/w dehydration, small increase in AST/ALT as well as bilirubin, but no signifcant RUQ tenderness to concern me for biliary pathology. D-dimer elevated, but no tachycardia, reported chest pain, PE seems less likely.  Symptoms and work-up c/w covid-19. Started on decadron. During my evaluation, the patient was noted to have oxygen saturations at 89 percent on RA, she was placed on 2L and improved to 94 percent. Will admit for covid hypoxia.    Final Clinical Impression(s) / ED Diagnoses Final diagnoses:  COVID-19    Rx / DC Orders ED Discharge Orders    None       Kathleen Lime, MD 11/21/20 2820    Eber Hong, MD 11/22/20 [phone]

## 2020-11-20 NOTE — H&P (Addendum)
Date: 11/21/2020               Patient Name:  Makayla Atkins MRN: [id number]  DOB: [birth year] Age / Sex: 87 y.o., female   PCP: Dolan Amen, MD         Medical Service: Internal Medicine Teaching Service         Attending Physician: Dr. Carlynn Purl, DO    First Contact: Dr. Ladora Daniel Pager: [phone]  Second Contact: Dr. Sande Brothers Pager: [phone]       After Hours (After 5p/  First Contact Pager: [phone]  weekends / holidays): Second Contact Pager: [phone]   Chief Complaint: AMS  History of Present Illness:   Aldene Hendon is an 87 year old female with history of HTN, HLD, atrial flutter on xarelto, and complete heart block s/p pacemaker presenting with altered mental status. Patient's son is at bedside and able to provide HPI.  Patient's son, [name], states that for the past 5 days, Makayla Atkins has been lethargic and altered with complete refusal to eat. She has been experiencing a cough but [name] is uncertain if it is productive. He denies any fever or chills. She has been denying SHOB. However, she has been unable to be as active due to generalized weakness and requiring significant assistance with ADLs, which she was able to do on her own beforehand. She denies chest pain, palpitations. [name] has not noticed any nausea or vomiting but she did have an episode of fecal incontinence. She denies any dysuria. [name] states that her last full meal was this past Monday, but she has had small snacks here and there. She had some tea on Wednesday, but she has been refusing to drink water for the past 2 days.  Patient lives with son and his wife and children. As per son, there were some family members with a febrile illness last week.  Of note, patient is from [place] and does not speak [place], but son does.  ED course: CBC and lactate unremarkable. CMP showing Cr 1.42, BUN 26, glucose 128, AST 96, ALT 46, T bili 1.5. COVID-19 positive. Urinalysis, blood cultures, procalcitonin,  inflammatory markers all pending. IMTS asked to admit.   Meds:  Current Meds  Medication Sig  . acetaminophen (TYLENOL) 325 MG tablet Take 650 mg by mouth every 6 (six) hours as needed for mild pain.  Marland Kitchen amLODipine (NORVASC) 5 MG tablet Take 1 tablet (5 mg total) by mouth daily. (Patient taking differently: Take 5 mg by mouth daily after lunch.)  . atorvastatin (LIPITOR) 40 MG tablet Take 1 tablet (40 mg total) by mouth daily. (Patient taking differently: Take 40 mg by mouth daily after lunch.)  . Cholecalciferol (VITAMIN D) 125 MCG (5000 UT) CAPS Take 5,000 Units by mouth daily after lunch.  . lisinopril (ZESTRIL) 40 MG tablet TAKE 1 TABLET BY MOUTH DAILY (Patient taking differently: Take 40 mg by mouth at bedtime.)  . polyvinyl alcohol (LIQUIFILM TEARS) 1.4 % ophthalmic solution Place 1 drop into both eyes as needed for dry eyes.  Carlena Hurl 20 MG TABS tablet TAKE 1 TABLET (20 MG TOTAL) BY MOUTH DAILY WITH SUPPER. (Patient taking differently: Take 20 mg by mouth daily after lunch.)   Allergies: Allergies as of 11/20/2020  . (No Known Allergies)   Past Medical History:  Diagnosis Date  . Glaucoma   . Hypertension    Family History:  Prostate Cancer  Social History:  Lifetime non-smoker Never drank alcohol or used recreational drugs. Lives with her  son, [name].   Review of Systems: A complete ROS was negative except as per HPI.  Physical Exam: Blood pressure 104/60, pulse 70, temperature 99.2 F (37.3 C), temperature source Oral, resp. rate 20, SpO2 95 %. Physical Exam Constitutional:      Appearance: She is obese.     Comments: Pleasant elderly female, lying in bed, NAD.  HENT:     Head: Normocephalic and atraumatic.     Mouth/Throat:     Mouth: Mucous membranes are dry.     Pharynx: Oropharynx is clear. No oropharyngeal exudate.  Eyes:     Extraocular Movements: Extraocular movements intact.     Conjunctiva/sclera: Conjunctivae normal.     Pupils: Pupils are equal,  round, and reactive to light.  Cardiovascular:     Pulses: Normal pulses.     Heart sounds: Normal heart sounds. No friction rub. No gallop.      Comments: Ventricle-paced rhythm. Pulmonary:     Effort: Pulmonary effort is normal. No respiratory distress.     Comments: Bibasilar rhonchi on exam. No accessory muscle usage. Abdominal:     General: Bowel sounds are normal. There is no distension.     Palpations: Abdomen is soft.     Tenderness: There is no abdominal tenderness.  Musculoskeletal:        General: No swelling. Normal range of motion.     Cervical back: Normal range of motion.  Skin:    General: Skin is warm and dry.  Neurological:     Mental Status: She is alert.     Comments: Alert and moving all extremities. Muscle strength 4/5 in all extremities. Unable to assess gait.      EKG: atrial-sensed ventricular-paced rhythm  DG Chest Port 1 View  Result Date: 11/20/2020 CLINICAL DATA:  Altered mental status EXAM: PORTABLE CHEST 1 VIEW COMPARISON:  09/01/2011 FINDINGS: Left pacer remains in place, unchanged. Left base atelectasis or infiltrate. No confluent opacity on the right. Heart is normal size. No effusions or acute bony abnormality. IMPRESSION: Left base opacity could reflect atelectasis or infiltrate. Electronically Signed   By: Charlett Nose M.D.   On: 11/20/2020 19:23    Assessment & Plan by Problem: Active Problems:   COVID-19 virus infection  Makayla Atkins is an 87 year old female with history of HTN, HLD, atrial flutter on xarelto, and complete heart block s/p pacemaker presenting with altered mental status and admitted for acute encephalopathy and COVID-19 infection.   Acute Encephalopathy COVID-19 PNA Patient likely lethargic and altered secondary to COVID. She does have a significant change from baseline mentation as per son and she is now unable to perform her normal ADLs. Patient officially diagnosed with COVID-19 today (11/20/2020) although some of her  family members did have a febrile illness last week. Patient is afebrile with no leukocytosis and normal lactate x2. Inflammatory markers elevated. CXR consistent with COVID-19 PNA vs possible superimposed bacterial infection given what sounded like a productive cough during our encounter (although, she did not cough anything up and may have swallowed it instead). Procalcitonin negative. Holding off on antibiotics at this time. -decadron -ICS and flutter valve -trend inflammatory markers -f/u blood cultures -PT/OT eval and treat -heart healthy diet in place, encourage PO intake -tylenol q6h prn -xarelto 15mg  daily -oxygen supplementation as needed to maintain O2 sats >90% -assess oxygenation while ambulating with RN  Acute Kidney Injury  On admission, Cr 1.42. Baseline Cr between ~0.9 - 1. This is likely prerenal because of very poor PO  intake over the past week. Furthermore, this may have been exacerbated by continued use of lisinopril with concomitant decreased PO intake. Will monitor response to fluid rehydration. -1L LR bolus -LR infusion @100  cc/hr for maintenance -Cr 1.42 > 1.14 this AM after fluid rehydration -f/u urinalysis -trend CMP -f/u bladder scan -strict I/O's, monitor UOP  Transaminitis AST 96, ALT 46, T bili 1.5 on admission. No history of alcohol use. Likely 2/2 COVID-19 infection. -LFTs came down slightly this AM -trend CMP  Atrial Flutter On xarelto at home. Stable, currently has a paced rhythm.  Complete heart block s/p pacemaker Stable. Patient has pacemaker in place.   Dispo: Admit patient to Observation with expected length of stay less than 2 midnights.  Signed: , MD  PGY-1 Pager: [phone] 11/21/2020, 3:11 AM

## 2020-11-21 ENCOUNTER — Other Ambulatory Visit: Payer: Self-pay

## 2020-11-21 DIAGNOSIS — E669 Obesity, unspecified: Secondary | ICD-10-CM | POA: Diagnosis present

## 2020-11-21 DIAGNOSIS — E785 Hyperlipidemia, unspecified: Secondary | ICD-10-CM | POA: Diagnosis present

## 2020-11-21 DIAGNOSIS — R0902 Hypoxemia: Secondary | ICD-10-CM | POA: Diagnosis present

## 2020-11-21 DIAGNOSIS — R627 Adult failure to thrive: Secondary | ICD-10-CM | POA: Diagnosis present

## 2020-11-21 DIAGNOSIS — U071 COVID-19: Secondary | ICD-10-CM

## 2020-11-21 DIAGNOSIS — J1282 Pneumonia due to coronavirus disease 2019: Secondary | ICD-10-CM | POA: Diagnosis present

## 2020-11-21 DIAGNOSIS — Z950 Presence of cardiac pacemaker: Secondary | ICD-10-CM | POA: Diagnosis not present

## 2020-11-21 DIAGNOSIS — I4892 Unspecified atrial flutter: Secondary | ICD-10-CM | POA: Diagnosis present

## 2020-11-21 DIAGNOSIS — H409 Unspecified glaucoma: Secondary | ICD-10-CM | POA: Diagnosis present

## 2020-11-21 DIAGNOSIS — Z7901 Long term (current) use of anticoagulants: Secondary | ICD-10-CM | POA: Diagnosis not present

## 2020-11-21 DIAGNOSIS — I10 Essential (primary) hypertension: Secondary | ICD-10-CM | POA: Diagnosis present

## 2020-11-21 DIAGNOSIS — N179 Acute kidney failure, unspecified: Secondary | ICD-10-CM | POA: Diagnosis present

## 2020-11-21 DIAGNOSIS — R0602 Shortness of breath: Secondary | ICD-10-CM | POA: Diagnosis present

## 2020-11-21 DIAGNOSIS — G9340 Encephalopathy, unspecified: Secondary | ICD-10-CM | POA: Diagnosis present

## 2020-11-21 DIAGNOSIS — Z79899 Other long term (current) drug therapy: Secondary | ICD-10-CM | POA: Diagnosis not present

## 2020-11-21 DIAGNOSIS — I442 Atrioventricular block, complete: Secondary | ICD-10-CM | POA: Diagnosis present

## 2020-11-21 DIAGNOSIS — Z6826 Body mass index (BMI) 26.0-26.9, adult: Secondary | ICD-10-CM | POA: Diagnosis not present

## 2020-11-21 HISTORY — DX: COVID-19: U07.1

## 2020-11-21 LAB — CBC WITH DIFFERENTIAL/PLATELET
Abs Immature Granulocytes: 0.03 10*3/uL (ref 0.00–0.07)
Basophils Absolute: 0 10*3/uL (ref 0.0–0.1)
Basophils Relative: 0 %
Eosinophils Absolute: 0 10*3/uL (ref 0.0–0.5)
Eosinophils Relative: 0 %
HCT: 35.7 % — ABNORMAL LOW (ref 36.0–46.0)
Hemoglobin: 11.2 g/dL — ABNORMAL LOW (ref 12.0–15.0)
Immature Granulocytes: 1 %
Lymphocytes Relative: 16 %
Lymphs Abs: 0.9 10*3/uL (ref 0.7–4.0)
MCH: 28.7 pg (ref 26.0–34.0)
MCHC: 31.4 g/dL (ref 30.0–36.0)
MCV: 91.5 fL (ref 80.0–100.0)
Monocytes Absolute: 0.1 10*3/uL (ref 0.1–1.0)
Monocytes Relative: 2 %
Neutro Abs: 4.5 10*3/uL (ref 1.7–7.7)
Neutrophils Relative %: 81 %
Platelets: 148 10*3/uL — ABNORMAL LOW (ref 150–400)
RBC: 3.9 MIL/uL (ref 3.87–5.11)
RDW: 13.5 % (ref 11.5–15.5)
WBC: 5.5 10*3/uL (ref 4.0–10.5)
nRBC: 0 % (ref 0.0–0.2)

## 2020-11-21 LAB — COMPREHENSIVE METABOLIC PANEL
ALT: 41 U/L (ref 0–44)
AST: 85 U/L — ABNORMAL HIGH (ref 15–41)
Albumin: 3.1 g/dL — ABNORMAL LOW (ref 3.5–5.0)
Alkaline Phosphatase: 46 U/L (ref 38–126)
Anion gap: 8 (ref 5–15)
BUN: 25 mg/dL — ABNORMAL HIGH (ref 8–23)
CO2: 25 mmol/L (ref 22–32)
Calcium: 8.5 mg/dL — ABNORMAL LOW (ref 8.9–10.3)
Chloride: 106 mmol/L (ref 98–111)
Creatinine, Ser: 1.14 mg/dL — ABNORMAL HIGH (ref 0.44–1.00)
GFR, Estimated: 46 mL/min — ABNORMAL LOW (ref >60)
Glucose, Bld: 139 mg/dL — ABNORMAL HIGH (ref 70–99)
Potassium: 4.3 mmol/L (ref 3.5–5.1)
Sodium: 139 mmol/L (ref 135–145)
Total Bilirubin: 1.4 mg/dL — ABNORMAL HIGH (ref 0.3–1.2)
Total Protein: 6.6 g/dL (ref 6.5–8.1)

## 2020-11-21 LAB — HEMOGLOBIN A1C
Hgb A1c MFr Bld: 6.2 % — ABNORMAL HIGH (ref 4.8–5.6)
Mean Plasma Glucose: 131.24 mg/dL

## 2020-11-21 LAB — D-DIMER, QUANTITATIVE: D-Dimer, Quant: 1.16 ug/mL-FEU — ABNORMAL HIGH (ref 0.00–0.50)

## 2020-11-21 LAB — FERRITIN: Ferritin: 1250 ng/mL — ABNORMAL HIGH (ref 11–307)

## 2020-11-21 LAB — C-REACTIVE PROTEIN: CRP: 9.2 mg/dL — ABNORMAL HIGH (ref <1.0)

## 2020-11-21 NOTE — ED Notes (Signed)
MS Breakfast Ordered 

## 2020-11-21 NOTE — Progress Notes (Signed)
   Subjective:  ON events: Admitted  Ms. Beldin was evaluated at bedside this morning, with her son present. Per her son, Ms. Peyser is much improved this AM. She has eaten some of her breakfast and seems more "energetic." She states that she feels better compared to yesterday. She has no complaints this AM.   Objective:  Vital signs in last 24 hours: Vitals:   11/21/20 1139 11/21/20 1145 11/21/20 1200 11/21/20 1215  BP: 111/68 109/67 112/67 112/66  Pulse: 67 67 65 67  Resp: 18 (!) 25 (!) 21 20  Temp: 97.7 F (36.5 C)     TempSrc: Oral     SpO2: 99% 93% 96% 96%   Physical Exam Constitutional:      General: She is not in acute distress.    Appearance: She is not ill-appearing.  HENT:     Head: Normocephalic and atraumatic.  Cardiovascular:     Rate and Rhythm: Normal rate and regular rhythm.     Pulses: Normal pulses.     Heart sounds: Normal heart sounds. No murmur heard. No friction rub. No gallop.   Pulmonary:     Effort: Pulmonary effort is normal.     Breath sounds: Rhonchi present. No wheezing or rales.     Comments: Rhonchi appreciated in the bases bilaterally Neurological:     Mental Status: She is alert.     Assessment/Plan:  Active Problems:   COVID-19 virus infection  Jodean Valade is an 87 year old female with history of HTN, HLD, atrial flutter on xarelto, and complete heart block s/p pacemaker presenting with altered mental status and admitted for acute encephalopathy and COVID-19 infection.   Acute Encephalopathy COVID-19 PNA: On RA this AM. Appears alert and able to answer questions correctly. Will have PT and OT work with patient for further recommendations.  - decadron 6 mg day 2/10 -ICS and flutter valve -trend inflammatory markers - BC x2 in process -PT/OT eval and treat -tylenol q6h prn -xarelto 15mg  daily -oxygen supplementation as needed to maintain O2 sats >90%  Acute Kidney Injury  On admission, Cr 1.42. Baseline Cr between ~0.9 - 1.  sCr decreased to 1.14 today. S/P 1 liter in the ED.  - Encourage PO fluid intake -Urinalysis Needs collecting -trend CMP -strict I/O's, monitor UOP  Elevated Transaminase Levels in the Setting of COVID 19 AST 96, ALT 46, T bili 1.5 on admission, trending down.  -trend CMP  Atrial Flutter On xarelto at home. Stable, currently has a paced rhythm.  Complete heart block s/p pacemaker Stable. Patient has pacemaker in place  Prior to Admission Living Arrangement: Home Anticipated Discharge Location: Pending PT/OT recommendations Barriers to Discharge: Continued medical work up Dispo: Anticipated discharge in approximately 1-2 day(s).   , MD 11/21/2020, 12:58 PM Pager: [phone] After 5pm on weekdays and 1pm on weekends: On Call pager [phone]

## 2020-11-21 NOTE — ED Notes (Signed)
Attempted report to 5W x1

## 2020-11-21 NOTE — Progress Notes (Signed)
This RN currently at the CT scan  Dept. To assist pt.  When ED RN called for report.    2228: This RN return the call for report.

## 2020-11-22 ENCOUNTER — Encounter (HOSPITAL_COMMUNITY): Payer: Self-pay | Admitting: Internal Medicine

## 2020-11-22 DIAGNOSIS — U071 COVID-19: Principal | ICD-10-CM

## 2020-11-22 LAB — COMPREHENSIVE METABOLIC PANEL
ALT: 38 U/L (ref 0–44)
AST: 68 U/L — ABNORMAL HIGH (ref 15–41)
Albumin: 3 g/dL — ABNORMAL LOW (ref 3.5–5.0)
Alkaline Phosphatase: 43 U/L (ref 38–126)
Anion gap: 9 (ref 5–15)
BUN: 23 mg/dL (ref 8–23)
CO2: 25 mmol/L (ref 22–32)
Calcium: 8.8 mg/dL — ABNORMAL LOW (ref 8.9–10.3)
Chloride: 106 mmol/L (ref 98–111)
Creatinine, Ser: 0.99 mg/dL (ref 0.44–1.00)
GFR, Estimated: 55 mL/min — ABNORMAL LOW (ref >60)
Glucose, Bld: 150 mg/dL — ABNORMAL HIGH (ref 70–99)
Potassium: 4.2 mmol/L (ref 3.5–5.1)
Sodium: 140 mmol/L (ref 135–145)
Total Bilirubin: 1.1 mg/dL (ref 0.3–1.2)
Total Protein: 6.2 g/dL — ABNORMAL LOW (ref 6.5–8.1)

## 2020-11-22 LAB — URINALYSIS, ROUTINE W REFLEX MICROSCOPIC
Bilirubin Urine: NEGATIVE
Glucose, UA: NEGATIVE mg/dL
Hgb urine dipstick: NEGATIVE
Ketones, ur: NEGATIVE mg/dL
Nitrite: NEGATIVE
Protein, ur: NEGATIVE mg/dL
Specific Gravity, Urine: 1.021 (ref 1.005–1.030)
pH: 6 (ref 5.0–8.0)

## 2020-11-22 LAB — CBC WITH DIFFERENTIAL/PLATELET
Abs Immature Granulocytes: 0 10*3/uL (ref 0.00–0.07)
Basophils Absolute: 0 10*3/uL (ref 0.0–0.1)
Basophils Relative: 0 %
Eosinophils Absolute: 0 10*3/uL (ref 0.0–0.5)
Eosinophils Relative: 0 %
HCT: 31.5 % — ABNORMAL LOW (ref 36.0–46.0)
Hemoglobin: 10.6 g/dL — ABNORMAL LOW (ref 12.0–15.0)
Lymphocytes Relative: 5 %
Lymphs Abs: 0.4 10*3/uL — ABNORMAL LOW (ref 0.7–4.0)
MCH: 29.4 pg (ref 26.0–34.0)
MCHC: 33.7 g/dL (ref 30.0–36.0)
MCV: 87.5 fL (ref 80.0–100.0)
Monocytes Absolute: 0.4 10*3/uL (ref 0.1–1.0)
Monocytes Relative: 5 %
Neutro Abs: 7 10*3/uL (ref 1.7–7.7)
Neutrophils Relative %: 90 %
Platelets: 153 10*3/uL (ref 150–400)
RBC: 3.6 MIL/uL — ABNORMAL LOW (ref 3.87–5.11)
RDW: 13 % (ref 11.5–15.5)
WBC: 7.8 10*3/uL (ref 4.0–10.5)
nRBC: 0 % (ref 0.0–0.2)
nRBC: 0 /100 WBC

## 2020-11-22 LAB — C-REACTIVE PROTEIN: CRP: 4.7 mg/dL — ABNORMAL HIGH (ref <1.0)

## 2020-11-22 LAB — FERRITIN: Ferritin: 1078 ng/mL — ABNORMAL HIGH (ref 11–307)

## 2020-11-22 LAB — D-DIMER, QUANTITATIVE: D-Dimer, Quant: 2.08 ug/mL-FEU — ABNORMAL HIGH (ref 0.00–0.50)

## 2020-11-22 NOTE — Discharge Summary (Signed)
Name: Makayla Atkins MRN: [id number] DOB: [birth year] 87 y.o. PCP: Dolan Amen, MD  Date of Admission: 11/20/2020  6:33 PM Date of Discharge:  11/22/20 Attending Physician: Earl Lagos, MD  Discharge Diagnosis: 1. COVID-19 2. Hypertension 3. Acute Kidney Injury   Discharge Medications: Allergies as of 11/22/2020   No Known Allergies     Medication List    STOP taking these medications   amLODipine 5 MG tablet Commonly known as: NORVASC   lisinopril 40 MG tablet Commonly known as: ZESTRIL     TAKE these medications   acetaminophen 325 MG tablet Commonly known as: TYLENOL Take 650 mg by mouth every 6 (six) hours as needed for mild pain.   atorvastatin 40 MG tablet Commonly known as: LIPITOR Take 1 tablet (40 mg total) by mouth daily. What changed: when to take this   polyvinyl alcohol 1.4 % ophthalmic solution Commonly known as: LIQUIFILM TEARS Place 1 drop into both eyes as needed for dry eyes.   Vitamin D 125 MCG (5000 UT) Caps Take 5,000 Units by mouth daily after lunch.   Xarelto 20 MG Tabs tablet Generic drug: rivaroxaban TAKE 1 TABLET (20 MG TOTAL) BY MOUTH DAILY WITH SUPPER. What changed: See the new instructions.       Disposition and follow-up:   [name] was [facility] from [facility] in Stable condition.  At the hospital follow up visit please address:  1.  COVID-19:   - Continued PO intake  - Need for O2 supplementation   2. Hypertension:   - Blood pressure check, consider adding back Norvasc 5mg  daily or lisinopril 40 mg daily.  3. Acute Kidney injury:   - F/U BMP  2.  Labs / imaging needed at time of follow-up: None  3.  Pending labs/ test needing follow-up: BMP  Follow-up Appointments:  Follow-up Information    , MD. Schedule an appointment as soon as possible for a visit in 2 week(s).   Specialty: Internal Medicine Contact information: [address] [place]  [place] [phone]        [phone], MD .   Specialty: Cardiology Contact information: [address] [place] [phone]               Hospital Course by problem list: 1. COVID-19 Patient presented to the ED with 5 days of being lethargic and not eating or drinking. She was found to have COVID 19 without an oxygen requirement. She received 2 doses of decadron 6 mg, and fluid resuscitation. She rapidly improved after receiving fluids and was discharged in stable condition.   2. Hypertension Patient admitted with COVID 19 pneumonia. During her admission her pressures were soft (systolic 100-11) and her norvasc was held, and her lisinopril was discontinued given her AKI. Throughout her admission, her pressures improved to systolics of 120s. She was discharged in stable condition with her amlodipine 5 mg and lisinopril 40 mg held.   3. Acute Kidney Injury: Patient presented to the ED with newly diagnosed COVID 19, presented with 5 days of decreased PO intake. Her initial sCr was 1.46 from 0.98 3 months prior. She was given LR bolus and infusion with improvement in her Cr to 0.99. She was discharged in stable condition.   Pertinent Labs, Studies, and Procedures:  CMP Latest Ref Rng & Units 11/22/2020 11/21/2020 11/20/2020  Glucose 70 - 99 mg/dL 11/22/2020) 528(U) 132(G)  BUN 8 - 23 mg/dL 23 401(U)  26(H)  Creatinine 0.44 - 1.00 mg/dL 9.50 9.32(I) 7.12(W)  Sodium 135 - 145 mmol/L 140 139 140  Potassium 3.5 - 5.1 mmol/L 4.2 4.3 3.7  Chloride 98 - 111 mmol/L 106 106 103  CO2 22 - 32 mmol/L 25 25 26   Calcium 8.9 - 10.3 mg/dL ) 5.8(K) 9.9(I)  Total Protein 6.5 - 8.1 g/dL 6.2(L) 6.6 7.2  Total Bilirubin 0.3 - 1.2 mg/dL 1.1 3.3(A) 2.5(K)  Alkaline Phos 38 - 126 U/L 43 46 46  AST 15 - 41 U/L 68(H) 85(H) 96(H)  ALT 0 - 44 U/L 38 41 46(H)     Discharge Instructions: Discharge Instructions    Call MD for:  extreme fatigue   Complete by: As directed     Call MD for:  persistant dizziness or light-headedness   Complete by: As directed    Call MD for:  persistant nausea and vomiting   Complete by: As directed    Call MD for:  temperature >100.4   Complete by: As directed    Diet - low sodium heart healthy   Complete by: As directed    Increase activity slowly   Complete by: As directed       Signed: 5.3(Z, MD 11/22/2020, 3:29 PM   Pager: [phone]

## 2020-11-22 NOTE — TOC Transition Note (Signed)
Transition of Care [facility]) - CM/SW Discharge Note   Patient Details  Name: Makayla Atkins MRN: [id number] Date of Birth: 15-Dec-1929  Transition of Care [facility]) CM/SW Contact:  Lawerance Sabal, RN Phone Number: 11/22/2020, 4:08 PM   Clinical Narrative:   Confirmed with patient's son that she is uninsured. Referral made to [facility], who is [facility] provider (only provider taking referrals for uninsured). They will contact patient's son to complete their assessment. Patient has RW, no other DME needs.  No Rxs have been sent out at this time, no medication needs identified.      Final next level of care: Home w Home Health Services Barriers to Discharge: No Barriers Identified   Patient Goals and CMS Choice        Discharge Placement                       Discharge Plan and Services                DME Arranged: N/A         HH Arranged: PT,OT          Social Determinants of Health (SDOH) Interventions     Readmission Risk Interventions No flowsheet data found.

## 2020-11-22 NOTE — Progress Notes (Signed)
   Subjective:  ON events: Admitted  [name] was evaluated at bedside this morning, with her son present. She had no changes overnight. She has not required oxygen, and is more energetic, per her son. She is consuming fluids without difficulty but her son does note continues to have poor appetite for food.   Discussed with patient's son that family who are in the house were more than likely exposed and need to quarantine/isolate pending their results.   Objective:  Vital signs in last 24 hours: Vitals:   11/21/20 2202 11/21/20 2345 11/22/20 0049 11/22/20 0352  BP: (!) 104/55 (!) 117/102 113/70 101/81  Pulse: 65 63 64 77  Resp: 17 20 17 16   Temp:  98 F (36.7 C) 98 F (36.7 C) 98.1 F (36.7 C)  TempSrc:   Oral Oral  SpO2: 96% 94% 96% 99%  Weight:   71.1 kg   Height:   5\' 4"  (1.626 m)    Physical Exam Constitutional:      General: She is not in acute distress.    Appearance: She is not ill-appearing.  HENT:     Head: Normocephalic and atraumatic.  Cardiovascular:     Rate and Rhythm: Normal rate and regular rhythm.     Pulses: Normal pulses.     Heart sounds: Normal heart sounds. No murmur heard. No friction rub. No gallop.   Pulmonary:     Effort: Pulmonary effort is normal.     Breath sounds: No wheezing, rhonchi or rales.  Neurological:     Mental Status: She is alert.     Assessment/Plan:  Active Problems:   COVID-19 virus infection   COVID-19  Makayla Atkins is an 87 year old female with history of HTN, HLD, atrial flutter on xarelto, and complete heart block s/p pacemaker presenting with altered mental status and admitted for acute encephalopathy and COVID-19 infection.   Acute Encephalopathy COVID-19 PNA: On RA this AM. Appears alert and able to answer questions correctly. Will have PT and OT work with patient for further recommendations. Believe will be able to discharge once PT/OT recommendations are in. - decadron 6 mg day 2/10 -ICS and flutter  valve -trend inflammatory markers - BC x2 in process -PT/OT eval and treat -tylenol q6h prn -xarelto 15mg  daily -oxygen supplementation as needed to maintain O2 sats >90%  Acute Kidney Injury  On admission, Cr 1.42. Baseline Cr between ~0.9 - 1. Cr at baseline today, .99 - Encourage PO fluid intake -Urinalysis Needs collecting -trend CMP -strict I/O's, monitor UOP  Elevated Transaminase Levels in the Setting of COVID 19 Liver enzymes trending downward, improving. -trend CMP  Atrial Flutter On xarelto at home. Stable, currently has a paced rhythm.  Complete heart block s/p pacemaker Stable. Patient has pacemaker in place  Prior to Admission Living Arrangement: Home Anticipated Discharge Location: Pending PT/OT recommendations Barriers to Discharge: Continued medical work up Dispo: Anticipated discharge in approximately 1-2 day(s).   98, MD 11/22/2020, 6:46 AM Pager: [phone] After 5pm on weekdays and 1pm on weekends: On Call pager [phone]

## 2020-11-22 NOTE — Discharge Instructions (Addendum)
To [name],   It was a pleasure taking care of you during your hospital stay. You were diagnosed with COVID 19 and given fluids and steroids. Please isolate for 15 days to help stop the spread of COVID 19. We will have you follow up in the clinic in two weeks time. Your blood pressures were also low during your stay with [place]. Please hold your amlodipine and your lisinopril. Please come back to the emergency department if you begin to feel short of breath, have high fevers, or experience altered mental status.  This was translated using Google translate.    Mme Omlor,  Ce fut un plaisir de prendre soin de vous pendant votre sjour  l'hpital. Vous avez reu un diagnostic de COVID 19 et on vous a donn des [employer]. Veuillez vous isoler pendant 15 jours pour aider  arrter [employer] COVID 19. Nous vous ferons suivre  la [employer] deux semaines. Steele Sizer revenir au service des urgences si vous commencez  vous sentir essouffl, si vous avez de fortes fivres ou si votre tat mental est altr. Cela a t traduit  l'aide de [employer]   10 Things You Can Do to Manage Your COVID-19 Symptoms at Home If you have possible or confirmed COVID-19: 1. Stay home except to get medical care. 2. Monitor your symptoms carefully. If your symptoms get worse, call your healthcare provider immediately. 3. Get rest and stay hydrated. 4. If you have a medical appointment, call the healthcare provider ahead of time and tell them that you have or may have COVID-19. 5. For medical emergencies, call 911 and notify the dispatch personnel that you have or may have COVID-19. 6. Cover your cough and sneezes with a tissue or use the inside of your elbow. 7. Wash your hands often with soap and water for at least 20 seconds or clean your hands with an alcohol-based hand sanitizer that contains at least 60% alcohol. 8. As much as possible, stay in a specific room and away from other people  in your home. Also, you should use a separate bathroom, if available. If you need to be around other people in or outside of the home, wear a mask. 9. Avoid sharing personal items with other people in your household, like dishes, towels, and bedding. 10. Clean all surfaces that are touched often, like counters, tabletops, and doorknobs. Use household cleaning sprays or wipes according to the label instructions. [email or web address] 05/08/2020 This information is not intended to replace advice given to you by your health care provider. Make sure you discuss any questions you have with your health care provider. Document Revised: 08/24/2020 Document Reviewed: 08/24/2020 Elsevier Patient Education  2021 Else[employer] Inc.  COVID-19 Quarantine vs. Isolation QUARANTINE keeps someone who was in close contact with someone who has COVID-19 away from others. Quarantine if you have been in close contact with someone who has COVID-19, unless you have been fully vaccinated. If you are fully vaccinated  You do NOT need to quarantine unless they have symptoms  Get tested 3-5 days after your exposure, even if you don't have symptoms  Wear a mask indoors in public for 14 days following exposure or until your test result is negative If you are not fully vaccinated  Stay home for 14 days after your last contact with a person who has COVID-19  Watch for fever (100.56F), cough, shortness of breath, or other symptoms of COVID-19  If possible, stay away from people you live  with, especially people who are at higher risk for getting very sick from COVID-19  Contact your local public health department for options in your area to possibly shorten your quarantine ISOLATION keeps someone who is sick or tested positive for COVID-19 without symptoms away from others, even in their own home. People who are in isolation should stay home and stay in a specific "sick room" or area and use a separate bathroom (if  available). If you are sick and think or know you have COVID-19 Stay home until after  At least 10 days since symptoms first appeared and  At least 24 hours with no fever without the use of fever-reducing medications and  Symptoms have improved If you tested positive for COVID-19 but do not have symptoms  Stay home until after 10 days have passed since your positive viral test  If you develop symptoms after testing positive, follow the steps above for those who are sick [email or web address] 07/20/2020 This information is not intended to replace advice given to you by your health care provider. Make sure you discuss any questions you have with your health care provider. Document Revised: 08/24/2020 Document Reviewed: 08/24/2020 Elsevier Patient Education  2021 Elsevier Inc.  COVID-19: What to Do if You Are Sick If you have a fever, cough or other symptoms, you might have COVID-19. Most people have mild illness and are able to recover at home. If you are sick:  Keep track of your symptoms.  If you have an emergency warning sign (including trouble breathing), call 911. Steps to help prevent the spread of COVID-19 if you are sick If you are sick with COVID-19 or think you might have COVID-19, follow the steps below to care for yourself and to help protect other people in your home and community. Stay home except to get medical care  Stay home. Most people with COVID-19 have mild illness and can recover at home without medical care. Do not leave your home, except to get medical care. Do not visit public areas.  Take care of yourself. Get rest and stay hydrated. Take over-the-counter medicines, such as acetaminophen, to help you feel better.  Stay in touch with your doctor. Call before you get medical care. Be sure to get care if you have trouble breathing, or have any other emergency warning signs, or if you think it is an emergency.  Avoid public transportation, ride-sharing, or  taxis. Separate yourself from other people As much as possible, stay in a specific room and away from other people and pets in your home. If possible, you should use a separate bathroom. If you need to be around other people or animals in or outside of the home, wear a mask. Tell your close contactsthat they may have been exposed to COVID-19. An infected person can spread COVID-19 starting 48 hours (or 2 days) before the person has any symptoms or tests positive. By letting your close contacts know they may have been exposed to COVID-19, you are helping to protect everyone.  Additional guidance is available for those living in close quarters and shared housing.  See COVID-19 and Animals if you have questions about pets.  If you are diagnosed with COVID-19, someone from the health department may call you. Answer the call to slow the spread. Monitor your symptoms  Symptoms of COVID-19 include fever, cough, or other symptoms.  Follow care instructions from your healthcare provider and local health department. Your local health authorities may give instructions on checking your symptoms and  reporting information. When to seek emergency medical attention Look for emergency warning signs* for COVID-19. If someone is showing any of these signs, seek emergency medical care immediately:  Trouble breathing  Persistent pain or pressure in the chest  New confusion  Inability to wake or stay awake  Pale, gray, or blue-colored skin, lips, or nail beds, depending on skin tone *This list is not all possible symptoms. Please call your medical provider for any other symptoms that are severe or concerning to you. Call 911 or call ahead to your local emergency facility: Notify the operator that you are seeking care for someone who has or may have COVID-19. Call ahead before visiting your doctor  Call ahead. Many medical visits for routine care are being postponed or done by phone or telemedicine.  If  you have a medical appointment that cannot be postponed, call your doctor's office, and tell them you have or may have COVID-19. This will help the office protect themselves and other patients. Get  tested  If you have symptoms of COVID-19, get tested. While waiting for test results, you stay away from others, including staying apart from those living in your household.  You can visit your state, tribal, local, and territorialhealth department's website to look for the latest local information on testing sites. If you are sick, wear a mask over your nose and mouth  You should wear a mask over your nose and mouth if you must be around other people or animals, including pets (even at home).  You don't need to wear the mask if you are alone. If you can't put on a mask (because of trouble breathing, for example), cover your coughs and sneezes in some other way. Try to stay at least 6 feet away from other people. This will help protect the people around you.  Masks should not be placed on young children under age 52 years, anyone who has trouble breathing, or anyone who is not able to remove the mask without help. Note: During the COVID-19 pandemic, medical grade facemasks are reserved for healthcare workers and some first responders. Cover your coughs and sneezes  Cover your mouth and nose with a tissue when you cough or sneeze.  Throw away used tissues in a lined trash can.  Immediately wash your hands with soap and water for at least 20 seconds. If soap and water are not available, clean your hands with an alcohol-based hand sanitizer that contains at least 60% alcohol. Clean your hands often  Wash your hands often with soap and water for at least 20 seconds. This is especially important after blowing your nose, coughing, or sneezing; going to the bathroom; and before eating or preparing food.  Use hand sanitizer if soap and water are not available. Use an alcohol-based hand sanitizer with at  least 60% alcohol, covering all surfaces of your hands and rubbing them together until they feel dry.  Soap and water are the best option, especially if hands are visibly dirty.  Avoid touching your eyes, nose, and mouth with unwashed hands.  Handwashing Tips Avoid sharing personal household items  Do not share dishes, drinking glasses, cups, eating utensils, towels, or bedding with other people in your home.  Wash these items thoroughly after using them with soap and water or put in the dishwasher. Clean all "high-touch" surfaces everyday  Clean and disinfect high-touch surfaces in your "sick room" and bathroom; wear disposable gloves. Let someone else clean and disinfect surfaces in common areas, but  you should clean your bedroom and bathroom, if possible.  If a caregiver or other person needs to clean and disinfect a sick person's bedroom or bathroom, they should do so on an as-needed basis. The caregiver/other person should wear a mask and disposable gloves prior to cleaning. They should wait as long as possible after the person who is sick has used the bathroom before coming in to clean and use the bathroom. ? High-touch surfaces include phones, remote controls, counters, tabletops, doorknobs, bathroom fixtures, toilets, keyboards, tablets, and bedside tables.  Clean and disinfect areas that may have blood, stool, or body fluids on them.  Use household cleaners and disinfectants. Clean the area or item with soap and water or another detergent if it is dirty. Then, use a household disinfectant. ? Be sure to follow the instructions on the label to ensure safe and effective use of the product. Many products recommend keeping the surface wet for several minutes to ensure germs are killed. Many also recommend precautions such as wearing gloves and making sure you have good ventilation during use of the product. ? Use a product from [employer] List N: Disinfectants for Coronavirus  (COVID-19). ? Complete Disinfection Guidance When you can be around others after being sick with COVID-19 Deciding when you can be around others is different for different situations. Find out when you can safely end home isolation. For any additional questions about your care, contact your healthcare provider or state or local health department. 01/08/2020 Content source: [facility] for Immun[facility] and Res[facility] Diseases ([facility]), Division of Viral Diseases This information is not intended to replace advice given to you by your health care provider. Make sure you discuss any questions you have with your health care provider. Document Revised: 08/24/2020 Document Reviewed: 08/24/2020 Elsevier Patient Education  2021 [employer].

## 2020-11-22 NOTE — Evaluation (Signed)
Occupational Therapy Evaluation Patient Details Name: Makayla Atkins MRN: [id number] DOB: October 29, 1929 Today's Date: 11/22/2020    History of Present Illness Pt is is an 87 year old female with history of HTN, HLD, atrial flutter, and complete heart block s/p pacemaker presenting with AMS; admitted for acute encephalopathy and COVID-19 infection.   Clinical Impression   This 87 y/o female presents with the above. PTA pt living with son/daughter-in-law and performing ADL and mobility tasks with mod independence using rollator. Pt very pleasant and wiling to participate in therapy session, son present and supportive, assisting to interpret during session. Today pt completing room level mobility using RW overall with minA (+2 utilized today for safety/equipment). Pt requiring x1 seated rest break by her door before mobilizing back to recliner. She requires up to maxA for LB ADL, minguard assist for seated UB ADL. Pt on RA with SpO2 >/=88% throughout. She will benefit from continued acute OT services and currently recommend follow up [facility] services after discharge to maximize her safety and independence with ADL and mobility.     Follow Up Recommendations  Home health OT;Supervision/Assistance - 24 hour (24hr initially)    Equipment Recommendations  None recommended by OT           Precautions / Restrictions Precautions Precautions: Fall Restrictions Weight Bearing Restrictions: No      Mobility Bed Mobility Overal bed mobility: Needs Assistance Bed Mobility: Supine to Sit     Supine to sit: Min assist     General bed mobility comments: light assist for trunk elevation    Transfers Overall transfer level: Needs assistance Equipment used: Rolling walker (2 wheeled) Transfers: Sit to/from Stand Sit to Stand: Min assist;Min guard;+2 safety/equipment         General transfer comment: minA initially to rise to standing from EOB and to steady at RW, able to stand from straight  back chair with mingaurd assist, VCs for safe hand placement    Balance Overall balance assessment: Needs assistance Sitting-balance support: Feet supported Sitting balance-Leahy Scale: Good Sitting balance - Comments: leaning forward to assist with socks with minguard for safety   Standing balance support: Bilateral upper extremity supported Standing balance-Leahy Scale: Poor Standing balance comment: reliant on UE support                           ADL either performed or assessed with clinical judgement   ADL Overall ADL's : Needs assistance/impaired Eating/Feeding: Set up;Sitting   Grooming: Supervision/safety;Sitting   Upper Body Bathing: Minimal assistance;Sitting   Lower Body Bathing: Moderate assistance;Sit to/from stand   Upper Body Dressing : Min guard;Sitting   Lower Body Dressing: Moderate assistance;Sit to/from stand;+2 for safety/equipment Lower Body Dressing Details (indicate cue type and reason): pt able to assist with donning R sock after it was started over her foot, required assist for L sock, limited due to fatigue Toilet Transfer: Minimal assistance;+2 for safety/equipment;Ambulation;RW Toilet Transfer Details (indicate cue type and reason): simulated via room level mobility and transfer to recliner, x1 seated rest break at chair close to sink Toileting- Clothing Manipulation and Hygiene: Sit to/from stand;Moderate assistance     Tub/Shower Transfer Details (indicate cue type and reason): son reports his spouse can assist pt initially with bathing tasks to ensure safety Functional mobility during ADLs: Minimal assistance;+2 for safety/equipment;Rolling walker       Vision         Perception     Praxis  Pertinent Vitals/Pain Pain Assessment: No/denies pain     Hand Dominance     Extremity/Trunk Assessment Upper Extremity Assessment Upper Extremity Assessment: Generalized weakness   Lower Extremity Assessment Lower Extremity  Assessment: Defer to PT evaluation   Cervical / Trunk Assessment Cervical / Trunk Assessment: Normal   Communication Communication Communication: Prefers language other than [place] (son assisting to interpret during session given pt's dialect not available through interpreter sytems)   Cognition Arousal/Alertness: Awake/alert Behavior During Therapy: WFL for tasks assessed/performed Overall Cognitive Status: Within Functional Limits for tasks assessed                                 General Comments: for basic tasks assessed today, very pleasant   General Comments  son present and very supportive    Exercises     Shoulder Instructions      Home Living Family/patient expects to be discharged to:: Private residence Living Arrangements: Children Available Help at Discharge: Available PRN/intermittently Type of Home: House Home Access: Level entry           Bathroom Shower/Tub: Chief Strategy Officer: Standard     Home Equipment: Emergency planning/management officer - 4 wheels          Prior Functioning/Environment Level of Independence: Independent with assistive device(s)        Comments: using rollator for mobility in the home        OT Problem List: Decreased strength;Decreased activity tolerance;Impaired balance (sitting and/or standing);Cardiopulmonary status limiting activity;Decreased knowledge of use of DME or AE      OT Treatment/Interventions: Self-care/ADL training;Therapeutic exercise;Energy conservation;DME and/or AE instruction;Therapeutic activities;Patient/family education;Balance training    OT Goals(Current goals can be found in the care plan section) Acute Rehab OT Goals Patient Stated Goal: home OT Goal Formulation: With patient Time For Goal Achievement: 12/06/20 Potential to Achieve Goals: Good  OT Frequency: Min 2X/week   Barriers to D/C:            Co-evaluation PT/OT/SLP Co-Evaluation/Treatment: Yes Reason for  Co-Treatment: For patient/therapist safety;To address functional/ADL transfers   OT goals addressed during session: ADL's and self-care      AM-PAC OT "6 Clicks" Daily Activity     Outcome Measure Help from another person eating meals?: A Little Help from another person taking care of personal grooming?: A Little Help from another person toileting, which includes using toliet, bedpan, or urinal?: A Lot Help from another person bathing (including washing, rinsing, drying)?: A Lot Help from another person to put on and taking off regular upper body clothing?: A Little Help from another person to put on and taking off regular lower body clothing?: A Lot 6 Click Score: 15   End of Session Equipment Utilized During Treatment: Gait belt;Rolling walker Nurse Communication: Mobility status  Activity Tolerance: Patient tolerated treatment well Patient left: in chair;with call bell/phone within reach;with family/visitor present  OT Visit Diagnosis: Muscle weakness (generalized) (M62.81);Unsteadiness on feet (R26.81)                Time: 0865-7846 OT Time Calculation (min): 29 min Charges:  OT General Charges $OT Visit: 1 Visit OT Evaluation $OT Eval Moderate Complexity: 1 Mod  Marcy Siren, OT Acute Rehabilitation Services Pager [phone] Office [phone]  Orlando Penner 11/22/2020, 4:59 PM

## 2020-11-22 NOTE — Evaluation (Signed)
Physical Therapy Evaluation Patient Details Name: Makayla Atkins MRN: [id number] DOB: 15-Mar-1930 Today's Date: 11/22/2020   History of Present Illness  Pt is is an 87 year old female with history of HTN, HLD, atrial flutter, and complete heart block s/p pacemaker presenting with AMS; admitted for acute encephalopathy and COVID-19 infection.  Clinical Impression   Patient evaluated by Physical Therapy with no further acute PT needs identified, as the plan is for discharge home today; PTA pt living with son/daughter-in-law and performing ADL and mobility tasks with mod independence using rollator. Pt very pleasant and wiling to participate in therapy session, son present and supportive, assisting to interpret during session. Today pt completing room level mobility using RW overall with minA (+2 utilized today for safety/equipment). Pt requiring x1 seated rest break by her door before mobilizing back to recliner All education has been completed and the patient has no further questions. Pt on RA with SpO2 >/=88% throughout. See below for any follow-up Physical Therapy or equipment needs. PT is signing off. Thank you for this referral.     Follow Up Recommendations Home health PT    Equipment Recommendations  None recommended by PT    Recommendations for Other Services       Precautions / Restrictions Precautions Precautions: Fall Restrictions Weight Bearing Restrictions: No      Mobility  Bed Mobility Overal bed mobility: Needs Assistance Bed Mobility: Supine to Sit     Supine to sit: Min assist     General bed mobility comments: light assist for trunk elevation    Transfers Overall transfer level: Needs assistance Equipment used: Rolling walker (2 wheeled) Transfers: Sit to/from Stand Sit to Stand: Min assist;Min guard;+2 safety/equipment         General transfer comment: minA initially to rise to standing from EOB and to steady at RW, able to stand from straight back  chair with mingaurd assist, VCs for safe hand placement  Ambulation/Gait Ambulation/Gait assistance: Min guard;+2 safety/equipment Gait Distance (Feet): 20 Feet (5+15) Assistive device: Rolling walker (2 wheeled) Gait Pattern/deviations: Step-through pattern;Trunk flexed     General Gait Details: Needed to sit after short initial amb; Heavy dependence on UE support on RW; Able to walk further with encouragement  Stairs            Wheelchair Mobility    Modified Rankin (Stroke Patients Only)       Balance Overall balance assessment: Needs assistance Sitting-balance support: Feet supported Sitting balance-Leahy Scale: Good Sitting balance - Comments: leaning forward to assist with socks with minguard for safety   Standing balance support: Bilateral upper extremity supported Standing balance-Leahy Scale: Poor Standing balance comment: reliant on UE support                             Pertinent Vitals/Pain Pain Assessment: No/denies pain    Home Living Family/patient expects to be discharged to:: Private residence Living Arrangements: Children Available Help at Discharge: Available PRN/intermittently Type of Home: House Home Access: Level entry       Home Equipment: Clinical cytogeneticist - 4 wheels      Prior Function Level of Independence: Independent with assistive device(s)         Comments: using rollator for mobility in the home     Hand Dominance        Extremity/Trunk Assessment   Upper Extremity Assessment Upper Extremity Assessment: Defer to OT evaluation    Lower Extremity Assessment Lower Extremity  Assessment: Generalized weakness (Pt's L hip has been bothering her and effecting her amb premorbidly)    Cervical / Trunk Assessment Cervical / Trunk Assessment: Normal  Communication   Communication: Prefers language other than [place] (son assisting to interpret during session given pt's dialect not available through  interpreter sytems)  Cognition Arousal/Alertness: Awake/alert Behavior During Therapy: WFL for tasks assessed/performed Overall Cognitive Status: Within Functional Limits for tasks assessed                                 General Comments: for basic tasks assessed today, very pleasant      General Comments General comments (skin integrity, edema, etc.): Son present and dupportive    Exercises     Assessment/Plan    PT Assessment All further PT needs can be met in the next venue of care  PT Problem List Decreased strength;Decreased activity tolerance;Decreased balance;Decreased mobility;Decreased knowledge of use of DME;Decreased knowledge of precautions;Cardiopulmonary status limiting activity       PT Treatment Interventions      PT Goals (Current goals can be found in the Care Plan section)  Acute Rehab PT Goals Patient Stated Goal: home PT Goal Formulation: All assessment and education complete, DC therapy    Frequency     Barriers to discharge        Co-evaluation PT/OT/SLP Co-Evaluation/Treatment: Yes Reason for Co-Treatment: For patient/therapist safety;To address functional/ADL transfers PT goals addressed during session: Mobility/safety with mobility OT goals addressed during session: ADL's and self-care       AM-PAC PT "6 Clicks" Mobility  Outcome Measure Help needed turning from your back to your side while in a flat bed without using bedrails?: A Little Help needed moving from lying on your back to sitting on the side of a flat bed without using bedrails?: A Little Help needed moving to and from a bed to a chair (including a wheelchair)?: A Little Help needed standing up from a chair using your arms (e.g., wheelchair or bedside chair)?: A Little Help needed to walk in hospital room?: A Little Help needed climbing 3-5 steps with a railing? : A Little 6 Click Score: 18    End of Session Equipment Utilized During Treatment: Gait  belt Activity Tolerance: Patient tolerated treatment well Patient left: in chair;with call bell/phone within reach Nurse Communication: Mobility status PT Visit Diagnosis: Unsteadiness on feet (R26.81);Other abnormalities of gait and mobility (R26.89)    Time: 5027-7412 PT Time Calculation (min) (ACUTE ONLY): 29 min   Charges:   PT Evaluation $PT Eval Moderate Complexity: 1 Mod          Roney [place], [place]  Acute Rehabilitation Services Pager [phone] Office [phone]   Colletta [place] 11/22/2020, 5:20 PM

## 2020-11-23 ENCOUNTER — Telehealth: Payer: Self-pay

## 2020-11-23 ENCOUNTER — Telehealth: Payer: Self-pay | Admitting: Internal Medicine

## 2020-11-23 NOTE — Telephone Encounter (Signed)
Received TC from [place] w/ [facility] requesting verbal orders for  PT 2X for 1 week to work on conditioning. VO given. Will forward to red team for agreement or denial Thank you, SChaplin, RN,BSN

## 2020-11-23 NOTE — Telephone Encounter (Signed)
[facility] TOC 12/09/2020 @ 3:15 pm with Dr. Roylene Reason.  Appointment letter mailed to patient.

## 2020-11-23 NOTE — Telephone Encounter (Signed)
-----   Message from Dolan Amen, MD sent at 11/22/2020  3:24 PM EST ----- Regarding: Schedule and Appointment To the [facility],  I hope you are well! Could you please schedule [name] for an office visit in 16 days (Needs to be in COVID Isolation for 15 days before coming in). Thank you!  Sincerely,  Dolan Amen, MD

## 2020-11-23 NOTE — Telephone Encounter (Signed)
I agree

## 2020-11-24 ENCOUNTER — Telehealth: Payer: Self-pay | Admitting: Emergency Medicine

## 2020-11-24 NOTE — Telephone Encounter (Signed)
Alert received for AF/paced rhythm. Patient has known Af , + Xarelto. Patient was diagnosed with Covid 11/20/20. Son reports his mother's condition has improved over the past 24 hours . He reports she has no BLE edema, no CP, chest pressure, dizziness, or syncope. Son instructed to call the office if his mother has a change in condition. ED precautions given .

## 2020-11-25 MED FILL — XARELTO 20 MG TABLET: 20 | 30 days supply | Qty: 30 | Fill #3

## 2020-11-26 LAB — CULTURE, BLOOD (ROUTINE X 2)
Culture: NO GROWTH
Culture: NO GROWTH
Special Requests: ADEQUATE

## 2020-11-27 ENCOUNTER — Telehealth: Payer: Self-pay | Admitting: Internal Medicine

## 2020-11-27 NOTE — Telephone Encounter (Signed)
Spoke with patients son Aurie Harroun.  He expressed that she went to the ED for COVID and was released on 11/20/20.  Norvasc and lisinopril were stopped at this time.  Her BP's were soft and she had AKI per ED note last sCr was 0.99.  I explained to son that she could not restart lisinopril without lab work.  Son did not have any recent BP readings.  He was told to check her BP and call back with readings.

## 2020-11-27 NOTE — Telephone Encounter (Signed)
Pt c/o medication issue:  1. Name of Medication:  amLODipine (NORVASC) 5 MG tablet  lisinopril (ZESTRIL) 40 MG tablet   2. How are you currently taking this medication (dosage and times per day)? Patient has not taken these medications since Sunday when she was discharged   3. Are you having a reaction (difficulty breathing--STAT)? no  4. What is your medication issue? Son is not sure why these medications were stopped when the patient was in the hospital.  Please call to discuss medication changes

## 2020-11-30 ENCOUNTER — Telehealth: Payer: Self-pay | Admitting: *Deleted

## 2020-11-30 ENCOUNTER — Other Ambulatory Visit: Payer: Self-pay | Admitting: Student

## 2020-11-30 ENCOUNTER — Ambulatory Visit (INDEPENDENT_AMBULATORY_CARE_PROVIDER_SITE_OTHER): Payer: Self-pay | Admitting: Student

## 2020-11-30 ENCOUNTER — Other Ambulatory Visit: Payer: Self-pay

## 2020-11-30 ENCOUNTER — Encounter: Payer: Self-pay | Admitting: Student

## 2020-11-30 DIAGNOSIS — I10 Essential (primary) hypertension: Secondary | ICD-10-CM

## 2020-11-30 DIAGNOSIS — N3000 Acute cystitis without hematuria: Secondary | ICD-10-CM

## 2020-11-30 DIAGNOSIS — N390 Urinary tract infection, site not specified: Secondary | ICD-10-CM | POA: Insufficient documentation

## 2020-11-30 DIAGNOSIS — N179 Acute kidney failure, unspecified: Secondary | ICD-10-CM | POA: Insufficient documentation

## 2020-11-30 DIAGNOSIS — U071 COVID-19: Secondary | ICD-10-CM

## 2020-11-30 MED ORDER — SULFAMETHOXAZOLE-TRIMETHOPRIM 800-160 MG PO TABS: 1.0000 | ORAL_TABLET | Freq: Two times a day (BID) | ORAL | 0 refills | Status: DC

## 2020-11-30 MED FILL — SULFAMETHOXAZOLE-TMP DS TAB: 800-160 | 3 days supply | Qty: 6 | Fill #0

## 2020-11-30 NOTE — Addendum Note (Signed)
Addended by: Remo Lipps on: 11/30/2020 02:59 PM   Modules accepted: Orders

## 2020-11-30 NOTE — Progress Notes (Signed)
  [facility] Internal Medicine Residency Telephone Encounter Continuity Care Appointment  HPI:   This telephone encounter was created for [name] on 11/30/2020 for the following purpose/cc COVID-19 infection, urinary frequency, AKI, HTN.    Past Medical History:  Past Medical History:  Diagnosis Date  . Glaucoma   . Hypertension       ROS:  Review of Systems  Constitutional: Negative for chills and fever.  Respiratory: Negative for cough and shortness of breath.   Cardiovascular: Negative for chest pain and palpitations.  Genitourinary: Positive for frequency. Negative for dysuria, flank pain and hematuria.  Neurological: Negative for loss of consciousness.       +confusion  All other systems reviewed and are negative.      Assessment / Plan / Recommendations:   Please see A&P under problem oriented charting for assessment of the patient's acute and chronic medical conditions.   As always, pt is advised that if symptoms worsen or new symptoms arise, they should go to an urgent care facility or to to ER for further evaluation.   Consent and Medical Decision Making:   Patient discussed with Dr. Mikey Bussing  This is a telephone encounter between [facility] and Remo Lipps on 11/30/2020 for hospital follow-up from acute encephalopathy due to COVID-19, urinary frequency. The visit was conducted with the patient located at home and Remo Lipps at [facility]. The patient's identity was confirmed using their DOB and current address. The patient has consented to being evaluated through a telephone encounter and understands the associated risks (an examination cannot be done and the patient may need to come in for an appointment) / benefits (allows the patient to remain at home, decreasing exposure to coronavirus). I personally spent 15 minutes on medical discussion.

## 2020-11-30 NOTE — Assessment & Plan Note (Signed)
Hospitalized for acute encephalopathy from 1/28-1/30 attributed to COVID-19 infection.  Since discharge she has had mild improvement, but daughter-in-law states she is tired at her baseline.  She also notes urinary frequency and that she has been unable to get to the bathroom in time, which is new for her.  The urine is noted to be cloudy.  Denies fever, pain, hematuria, malodorous urine.  She had a urinalysis on 1/30 which demonstrated trace leukocytes and rare bacteria, no urine culture available.  -Given her symptoms and labs suggesting bacteriuria, start Bactrim for 3 days empirically to treat acute uncomplicated cystitis

## 2020-11-30 NOTE — Assessment & Plan Note (Signed)
At recent admission for COVID-19 infection, patient was normotensive on presentation so BP meds (lisinopril 40mg  and amlodipine 5mg ) were held. Lisinopril also held for co-morbid AKI which resolved afterwards. She remained normotensive without them, so were discontinued at discharge.  Daughter-in-law reports blood pressure measurements of 117-125 / 60-80 at home.  -Holding off on previous medications of lisinopril 40 mg and amlodipine 5 mg for now -Recheck blood pressure next visit

## 2020-11-30 NOTE — Assessment & Plan Note (Signed)
Patient presented to the ED on 1/28 with chief complaint of confusion, she had a positive Covid test on 1/28.  Did not have significant respiratory symptoms or oxygen requirement.  Received 2 days of Decadron.  Since going home, daughter-in-law states that she has had mild improvement in confusion but is still not back to her baseline.  For example, she had her sock and patient did not overdo with it.  Also notes that she has been urinating more frequently and has been unable to make it to the bathroom in time which is new for her.  Has also had other issues with ADLs above baseline.  No respiratory symptoms at this time, pulse ox of 94% by home health PT.  -No specific therapy for COVID-19 as she has still no respiratory symptoms -Continue home health PT -Follow-up in clinic on 2/16

## 2020-11-30 NOTE — Assessment & Plan Note (Signed)
On recent admission from 1/28-1/30, she was found to have an AKI with creatinineof 1.42 on admission on 1/28 which improved to 0.99 by day of discharge 2 days later. Thought to be pre-renal from poor PO intake due to Covid infection. Daughter states she has had continued poor PO intake since going home.  -Check BMP at next office visit on 2/16

## 2020-11-30 NOTE — Telephone Encounter (Signed)
Patient's daughter called in asking for urine results taken on day of discharge. States patient has been confused since before her recent hospitalization for covid and wonders if she has a UTI. Only rare bacteria and trace WBCs. Given tele appt today with Red Team. Please call daughter at [phone]. Kinnie Feil, BSN, RN-BC

## 2020-12-01 NOTE — Telephone Encounter (Signed)
It appears patient had a virtual visit with Dr. Imogene Burn, Internal Med yesterday.  Plan was set for blood pressure medications:  -Holding off on previous medications of lisinopril 40 mg and amlodipine 5 mg for now -Recheck blood pressure next visit

## 2020-12-02 NOTE — Progress Notes (Signed)
Internal Medicine Clinic Attending  Case discussed with Dr. Chen  At the time of the visit.  We reviewed the resident's history and exam and pertinent patient test results.  I agree with the assessment, diagnosis, and plan of care documented in the resident's note. 

## 2020-12-09 ENCOUNTER — Ambulatory Visit (INDEPENDENT_AMBULATORY_CARE_PROVIDER_SITE_OTHER): Payer: Self-pay | Admitting: Student

## 2020-12-09 ENCOUNTER — Encounter: Payer: Self-pay | Admitting: Student

## 2020-12-09 ENCOUNTER — Other Ambulatory Visit: Payer: Self-pay

## 2020-12-09 VITALS — BP 114/64 | HR 64 | Temp 97.5°F | Ht 63.0 in | Wt 153.6 lb

## 2020-12-09 DIAGNOSIS — U071 COVID-19: Secondary | ICD-10-CM

## 2020-12-09 DIAGNOSIS — I10 Essential (primary) hypertension: Secondary | ICD-10-CM

## 2020-12-09 DIAGNOSIS — N179 Acute kidney failure, unspecified: Secondary | ICD-10-CM

## 2020-12-09 NOTE — Progress Notes (Signed)
   CC: Hospital follow-up  HPI:  Ms.Stephnie Walder is a 87 y.o. female with past medical history significant for HTN, HLD, CHB s/p PPM, and recent hospitalization from 1/28 to 1/30 for altered mental status in the setting of COVID-19 infection who presents to clinic for follow-up. Refer to problem list for charting of this encounter.  Past Medical History:  Diagnosis Date  . Glaucoma   . Hypertension    Review of Systems:  Denies confusion, weakness, abdominal pain, urinary frequency, urinary urgency, pain with urination, fevers, chills, cough, shortness of breath.  Physical Exam:  Vitals:   12/09/20 1525  BP: 114/64  Pulse: 64  Temp: (!) 97.5 F (36.4 C)  TempSrc: Oral  SpO2: 97%  Weight: 153 lb 9.6 oz (69.7 kg)  Height: 5\' 3"  (1.6 m)   Physical Exam Constitutional:      General: She is not in acute distress.    Appearance: Normal appearance.  Cardiovascular:     Rate and Rhythm: Normal rate and regular rhythm.     Pulses: Normal pulses.     Heart sounds: Normal heart sounds.  Pulmonary:     Effort: Pulmonary effort is normal. No respiratory distress.     Breath sounds: Normal breath sounds.  Abdominal:     General: Abdomen is flat. Bowel sounds are normal.     Palpations: Abdomen is soft.     Tenderness: There is no abdominal tenderness.  Neurological:     General: No focal deficit present.     Mental Status: She is alert. Mental status is at baseline.  Psychiatric:        Mood and Affect: Mood normal.        Behavior: Behavior normal.      Assessment & Plan:   See Encounters Tab for problem based charting.  Patient discussed with Dr. 

## 2020-12-09 NOTE — Assessment & Plan Note (Signed)
Patient is currently sixteen days out from positive COVID-19 test result. She denies cough, shortness of breath, fevers, chills. Her strength, ability to ambulate independently, appetite and memory are much improved. Lungs are clear to auscultation bilaterally. Patient does not require any further management of this condition.

## 2020-12-09 NOTE — Assessment & Plan Note (Signed)
During hospitalization, patient's amlodipine and lisinopril held in the setting of being normotensive with acute kidney injury. Patient's blood pressure today in clinic is 114/64. She does not require resumption of her antihypertensive medications at this time. -Discontinue amlodipine 5mg  daily -Discontinue lisinopril 40mg  daily

## 2020-12-09 NOTE — Patient Instructions (Signed)
[name],  It was a pleasure meeting you in clinic. We are happy to hear that you are doing much better following your recent hospitalization.  For your hypertension (high blood pressure): We ask that you continue to not take the amlodipine and lisinopril. Your blood pressure looks perfect off of these medications.  For your acute kidney injury: Your kidney function looked better by the time of discharge, but we would like to check a basic metabolic panel today to ensure that your kidney function has fully improved. I will call about the results.  Otherwise, you are doing great. Please let us know if you have any further questions or concerns.  Sincerely, Dr. Jasmine December, MD

## 2020-12-09 NOTE — Assessment & Plan Note (Signed)
Patient reportedly doing much better since hospitalization with improved memory, appetite, and strength. She has been eating and drinking appropriately.  -Obtain BMP to assess for resolution of AKI

## 2020-12-10 LAB — BMP8+ANION GAP
Anion Gap: 19 mmol/L — ABNORMAL HIGH (ref 10.0–18.0)
BUN/Creatinine Ratio: 15 (ref 12–28)
BUN: 17 mg/dL (ref 8–27)
CO2: 20 mmol/L (ref 20–29)
Calcium: 9.3 mg/dL (ref 8.7–10.3)
Chloride: 104 mmol/L (ref 96–106)
Creatinine, Ser: 1.1 mg/dL — ABNORMAL HIGH (ref 0.57–1.00)
GFR calc Af Amer: 52 mL/min/{1.73_m2} — ABNORMAL LOW (ref >59)
GFR calc non Af Amer: 45 mL/min/{1.73_m2} — ABNORMAL LOW (ref >59)
Glucose: 98 mg/dL (ref 65–99)
Potassium: 4.5 mmol/L (ref 3.5–5.2)
Sodium: 143 mmol/L (ref 134–144)

## 2020-12-10 NOTE — Progress Notes (Signed)
Internal Medicine Clinic Attending  Case discussed with Dr. Johnson  At the time of the visit.  We reviewed the resident's history and exam and pertinent patient test results.  I agree with the assessment, diagnosis, and plan of care documented in the resident's note.  

## 2020-12-11 ENCOUNTER — Ambulatory Visit (INDEPENDENT_AMBULATORY_CARE_PROVIDER_SITE_OTHER): Payer: Self-pay

## 2020-12-11 DIAGNOSIS — I442 Atrioventricular block, complete: Secondary | ICD-10-CM

## 2020-12-11 LAB — CUP PACEART REMOTE DEVICE CHECK
Battery Remaining Longevity: 103 mo
Battery Remaining Percentage: 95.5 %
Battery Voltage: 2.99 V
Brady Statistic AP VP Percent: 67 %
Brady Statistic AP VS Percent: 1 %
Brady Statistic AS VP Percent: 33 %
Brady Statistic AS VS Percent: 1 %
Brady Statistic RA Percent Paced: 62 %
Brady Statistic RV Percent Paced: 98 %
Date Time Interrogation Session: 20220218020038
Implantable Lead Implant Date: 20201107
Implantable Lead Implant Date: 20201107
Implantable Lead Location: 753859
Implantable Lead Location: 753860
Implantable Pulse Generator Implant Date: 20201107
Lead Channel Impedance Value: 440 Ohm
Lead Channel Impedance Value: 510 Ohm
Lead Channel Pacing Threshold Amplitude: 0.75 V
Lead Channel Pacing Threshold Amplitude: 1 V
Lead Channel Pacing Threshold Pulse Width: 0.5 ms
Lead Channel Pacing Threshold Pulse Width: 0.5 ms
Lead Channel Sensing Intrinsic Amplitude: 12 mV
Lead Channel Sensing Intrinsic Amplitude: 4.2 mV
Lead Channel Setting Pacing Amplitude: 2 V
Lead Channel Setting Pacing Amplitude: 2.5 V
Lead Channel Setting Pacing Pulse Width: 0.5 ms
Lead Channel Setting Sensing Sensitivity: 4 mV
Pulse Gen Model: 2272
Pulse Gen Serial Number: 9175795

## 2020-12-15 NOTE — Progress Notes (Signed)
Remote pacemaker transmission.   

## 2020-12-29 MED FILL — XARELTO 20 MG TABLET: 20 | 30 days supply | Qty: 30 | Fill #4

## 2021-02-03 ENCOUNTER — Other Ambulatory Visit (HOSPITAL_COMMUNITY): Payer: Self-pay

## 2021-02-03 MED FILL — Rivaroxaban Tab 20 MG: ORAL | 30 days supply | Qty: 30 | Fill #0 | Status: AC

## 2021-02-04 ENCOUNTER — Other Ambulatory Visit (HOSPITAL_COMMUNITY): Payer: Self-pay

## 2021-02-05 ENCOUNTER — Other Ambulatory Visit (HOSPITAL_COMMUNITY): Payer: Self-pay

## 2021-03-12 ENCOUNTER — Ambulatory Visit (INDEPENDENT_AMBULATORY_CARE_PROVIDER_SITE_OTHER): Payer: Self-pay

## 2021-03-12 DIAGNOSIS — I442 Atrioventricular block, complete: Secondary | ICD-10-CM

## 2021-03-12 LAB — CUP PACEART REMOTE DEVICE CHECK
Battery Remaining Longevity: 102 mo
Battery Remaining Percentage: 95.5 %
Battery Voltage: 2.99 V
Brady Statistic AP VP Percent: 69 %
Brady Statistic AP VS Percent: 1 %
Brady Statistic AS VP Percent: 30 %
Brady Statistic AS VS Percent: 1 %
Brady Statistic RA Percent Paced: 67 %
Brady Statistic RV Percent Paced: 99 %
Date Time Interrogation Session: 20220520034502
Implantable Lead Implant Date: 20201107
Implantable Lead Implant Date: 20201107
Implantable Lead Location: 753859
Implantable Lead Location: 753860
Implantable Pulse Generator Implant Date: 20201107
Lead Channel Impedance Value: 460 Ohm
Lead Channel Impedance Value: 480 Ohm
Lead Channel Pacing Threshold Amplitude: 0.75 V
Lead Channel Pacing Threshold Amplitude: 1 V
Lead Channel Pacing Threshold Pulse Width: 0.5 ms
Lead Channel Pacing Threshold Pulse Width: 0.5 ms
Lead Channel Sensing Intrinsic Amplitude: 12 mV
Lead Channel Sensing Intrinsic Amplitude: 3.8 mV
Lead Channel Setting Pacing Amplitude: 2 V
Lead Channel Setting Pacing Amplitude: 2.5 V
Lead Channel Setting Pacing Pulse Width: 0.5 ms
Lead Channel Setting Sensing Sensitivity: 4 mV
Pulse Gen Model: 2272
Pulse Gen Serial Number: 9175795

## 2021-03-15 ENCOUNTER — Other Ambulatory Visit (HOSPITAL_COMMUNITY): Payer: Self-pay

## 2021-03-15 ENCOUNTER — Other Ambulatory Visit: Payer: Self-pay

## 2021-03-15 DIAGNOSIS — I483 Typical atrial flutter: Secondary | ICD-10-CM

## 2021-03-15 NOTE — Telephone Encounter (Addendum)
Xarelto 20mg  refill request received. Pt is 87 years old, weight-69.7kg, Crea-1.10 on 12/03/2020, last seen by 01/31/2021 on 10/02/2020, Diagnosis-Aflutter, CrCl-38.4ml/min; Dose is inappropriate based on dosing criteria. Will send a message to Cardiologist.

## 2021-03-16 ENCOUNTER — Telehealth: Payer: Self-pay | Admitting: *Deleted

## 2021-03-16 MED ORDER — RIVAROXABAN 15 MG PO TABS: 15.0000 mg | ORAL_TABLET | Freq: Every day | ORAL | 1 refills | Status: DC

## 2021-03-16 NOTE — Addendum Note (Signed)
Addended by: Cory Roughen on: 03/16/2021 03:48 PM   Modules accepted: Orders

## 2021-03-16 NOTE — Telephone Encounter (Signed)
-----   Message from Hillis Range, MD sent at 03/16/2021  2:37 PM EDT ----- Please reduce dose to xarelto 15mg  daily.  Thank you.   ----- Message ----- From: , RN Sent: 03/15/2021   1:51 PM EDT To: 03/17/2021, MD  Good afternoon,  The pt has requested a refill on her Xarelto 20mg  tablet. She is 87 years old, weight-69.7kg, Crea-1.10 on 12/03/2020, last seen by 98 on 10/02/2020, Diagnosis-Aflutter, and CrCl-38.30ml/min. According to dosing criteria, pt is not on the correct dose. Please advise on doseage.    Thanks

## 2021-03-16 NOTE — Telephone Encounter (Signed)
Called linus and left a message to call regarding the pt. Will need to updated the pt before sending in the refill.   Called the alternative number and spoke with the dtr in law and she will have the son call regarding an update on the medication matter.

## 2021-03-16 NOTE — Telephone Encounter (Signed)
Linus called back. Made him aware that pt's Xarelto dose is decreasing from Xarelto 20 mg (1 tablet daily) to Xarelto 15mg  (1 tablet daily). Son verbalized understanding and requested a 3 month of Xarelto be sent it. Prescription refill sent.

## 2021-03-16 NOTE — Telephone Encounter (Signed)
-----   Message from James [name], MD sent at 03/16/2021  2:37 PM EDT ----- Please reduce dose to xarelto 15mg daily.  Thank you.   ----- Message ----- From: Adasia Hoar B, RN Sent: 03/15/2021   1:51 PM EDT To: James [name], MD  Good afternoon,  The pt has requested a refill on her Xarelto 20mg tablet. She is 87 years old, weight-69.7kg, Crea-1.10 on 12/03/2020, last seen by Renee Ursuy on 10/02/2020, Diagnosis-Aflutter, and CrCl-38.19ml/min. According to dosing criteria, pt is not on the correct dose. Please advise on doseage.    Thanks   

## 2021-03-16 NOTE — Telephone Encounter (Addendum)
Per [name] the Xarelto dose needs to be reduced to Xarelto 15mg . Will not fill the Xarelto 20mg  at this time.  Called pt & left a message to call back. Will need to update them since dose needs to be changed. Will deny this refill as another encounter for xarelto 15mg  is opened in patient calls.

## 2021-03-18 ENCOUNTER — Other Ambulatory Visit (HOSPITAL_COMMUNITY): Payer: Self-pay

## 2021-03-18 ENCOUNTER — Telehealth: Payer: Self-pay | Admitting: *Deleted

## 2021-03-18 MED ORDER — RIVAROXABAN 15 MG PO TABS
15.0000 mg | ORAL_TABLET | Freq: Every day | ORAL | 1 refills | Status: DC
  Filled 2021-03-18: qty 30, 30d supply, fill #0
  Filled 2021-03-19 – 2021-06-04 (×2): qty 90, 90d supply, fill #0

## 2021-03-18 NOTE — Telephone Encounter (Signed)
Phone call received from [facility] outpatient pharmacy inquiring if the patient is receiving assistance for her xarelto.    Reported that I didn't see any documentation stating she was.

## 2021-03-18 NOTE — Telephone Encounter (Signed)
Trish with [facility] called to request Rx for Xarelto 15 mg be resent to them under IM Program. Original Rx sent on 5/24 by [facility] who does not have a discount program. Will forward to Attendings for consideration.

## 2021-03-19 ENCOUNTER — Telehealth: Payer: Self-pay | Admitting: Internal Medicine

## 2021-03-19 ENCOUNTER — Other Ambulatory Visit (HOSPITAL_COMMUNITY): Payer: Self-pay

## 2021-03-19 NOTE — Telephone Encounter (Signed)
Pt c/o medication issue:  1. Name of Medication: Rivaroxaban (XARELTO) 15 MG TABS tablet  2. How are you currently taking this medication (dosage and times per day)? 15 mg daily  3. Are you having a reaction (difficulty breathing--STAT)?   4. What is your medication issue? Cost  Son of patient states there was supposed to be some sort of discount attached to the rx when it was to be sent to the [facility] pharmacy. The patient can not afford the medication.  The patient is out of medication

## 2021-03-19 NOTE — Telephone Encounter (Signed)
Called Makayla Atkins to find out if patient has any insurance information on file.  Gave all that they had on file.   Called out patient pharmacy no insurance on file. Stated they filed the medication through the IM fund with internal medicine in the past.  They contacted them yesterday (5/26) and she spoke with a nurse who stated they saw that they covered the patient before and they would have a doctor resent it from there office. The [facility] has not heard back yet from IM. Advised I could give sample while they worked on this. The pharmacy will contact IM again on Tuesday when there office is open for an update.   I will call the son to pick up samples.

## 2021-03-19 NOTE — Telephone Encounter (Signed)
[facility] calling to follow up with the following Rx   Rivaroxaban (XARELTO) 15 MG TABS tablet  [facility] (Ph: [phone])

## 2021-03-19 NOTE — Telephone Encounter (Signed)
Please see Lauren's note below. Thank you, Kaity Pitstick

## 2021-03-19 NOTE — Telephone Encounter (Signed)
Left message on son's phone to call office back as soon as possible.

## 2021-03-19 NOTE — Telephone Encounter (Signed)
See phone note from 03/18/21

## 2021-03-19 NOTE — Addendum Note (Signed)
Addended by: Maybelle Depaoli E on: 03/19/2021 02:58 PM   Modules accepted: Orders

## 2021-03-19 NOTE — Telephone Encounter (Addendum)
Spoke to patients son, he is picking up one week of samples. Pharmacy will get in touch with IM again on Tuesday.

## 2021-03-23 ENCOUNTER — Other Ambulatory Visit (HOSPITAL_COMMUNITY): Payer: Self-pay

## 2021-03-23 MED ORDER — RIVAROXABAN 15 MG PO TABS
15.0000 mg | ORAL_TABLET | Freq: Every day | ORAL | 1 refills | Status: DC
  Filled 2021-03-23: qty 30, 30d supply, fill #0
  Filled 2021-06-04: qty 30, 30d supply, fill #1

## 2021-03-23 NOTE — Telephone Encounter (Signed)
Done. Thanks.

## 2021-03-23 NOTE — Telephone Encounter (Signed)
IM stands for internal medicine. Looks like internal medicine was contacted and is aware they need to send Rx over to [facility] pharmacy to receive the discount.

## 2021-03-25 ENCOUNTER — Other Ambulatory Visit (HOSPITAL_COMMUNITY): Payer: Self-pay

## 2021-03-29 NOTE — Progress Notes (Signed)
Remote pacemaker transmission.   

## 2021-04-27 ENCOUNTER — Encounter: Payer: Self-pay | Admitting: *Deleted

## 2021-06-04 ENCOUNTER — Other Ambulatory Visit (HOSPITAL_COMMUNITY): Payer: Self-pay

## 2021-06-11 ENCOUNTER — Ambulatory Visit (INDEPENDENT_AMBULATORY_CARE_PROVIDER_SITE_OTHER): Payer: Self-pay

## 2021-06-11 DIAGNOSIS — I442 Atrioventricular block, complete: Secondary | ICD-10-CM

## 2021-06-13 LAB — CUP PACEART REMOTE DEVICE CHECK
Battery Remaining Longevity: 75 mo
Battery Remaining Percentage: 75 %
Battery Voltage: 2.99 V
Brady Statistic AP VP Percent: 72 %
Brady Statistic AP VS Percent: 1 %
Brady Statistic AS VP Percent: 28 %
Brady Statistic AS VS Percent: 1 %
Brady Statistic RA Percent Paced: 66 %
Brady Statistic RV Percent Paced: 99 %
Date Time Interrogation Session: 20220819020027
Implantable Lead Implant Date: 20201107
Implantable Lead Implant Date: 20201107
Implantable Lead Location: 753859
Implantable Lead Location: 753860
Implantable Pulse Generator Implant Date: 20201107
Lead Channel Impedance Value: 440 Ohm
Lead Channel Impedance Value: 490 Ohm
Lead Channel Pacing Threshold Amplitude: 0.75 V
Lead Channel Pacing Threshold Amplitude: 1 V
Lead Channel Pacing Threshold Pulse Width: 0.5 ms
Lead Channel Pacing Threshold Pulse Width: 0.5 ms
Lead Channel Sensing Intrinsic Amplitude: 12 mV
Lead Channel Sensing Intrinsic Amplitude: 3.3 mV
Lead Channel Setting Pacing Amplitude: 2 V
Lead Channel Setting Pacing Amplitude: 2.5 V
Lead Channel Setting Pacing Pulse Width: 0.5 ms
Lead Channel Setting Sensing Sensitivity: 4 mV
Pulse Gen Model: 2272
Pulse Gen Serial Number: 9175795

## 2021-06-17 ENCOUNTER — Other Ambulatory Visit: Payer: Self-pay | Admitting: *Deleted

## 2021-06-17 DIAGNOSIS — E785 Hyperlipidemia, unspecified: Secondary | ICD-10-CM

## 2021-06-17 MED ORDER — ATORVASTATIN CALCIUM 40 MG PO TABS: 40.0000 mg | ORAL_TABLET | Freq: Every day | ORAL | 0 refills | Status: DC

## 2021-06-29 NOTE — Progress Notes (Signed)
Remote pacemaker transmission.   

## 2021-07-22 ENCOUNTER — Telehealth: Payer: Self-pay

## 2021-07-22 NOTE — Telephone Encounter (Signed)
Makayla Atkins, Makayla Atkins, Makayla Atkins, Makayla likely Makayla with [facility]  Spoke with pt son [name] ([facility] Makayla file).  He states that pt has not had complaints of feeling poorly.  He confirms that pt is complinat with Xarelto as prescribed.    Pt overdue for in-clinic appt, LOV was 10/02/20 with RU.  Recall present for 03/2021 with DR. [name].    Sending message to scheduling to contact pt son [name] for scheduling.

## 2021-07-25 NOTE — Telephone Encounter (Signed)
Ok to schedule with AF clinic or EP APP.

## 2021-07-27 NOTE — Progress Notes (Signed)
Cardiology Office Note Date:  07/28/2021  Patient ID:  Makayla Atkins, DOB 09/03/1930, MRN [id number] PCP:  Dolan Amen, MD  Cardiologist/Electrophysiologist: Dr. Johney Frame     Chief Complaint:   35mo visit  History of Present Illness: Makayla Atkins is a 87 y.o. female with history of HTN, HLD, AFlutter, admitted with symptomatic bradycardia/advanced AV block Nov 2020 >> PPM  She comes in today to be seen for Dr. Johney Frame, last seen by him Feb 2021 via tele health visit, discussed need for clinic visit for chronic output settings and if still in AFlutter, condieration for VVIR programming.   I saw her 10/02/20 She is accompanied by her son Makayla Atkins who translates. We reviewed the available languages on our interpretor, though he felt she would not be able to understand any of the close options. They are from [place], she speaks a dialect, Probation officer.  She is doing well. No CP, palpitations or cardiac awareness. No SOB, DOE She is mostly bothered by a chronic R wrist aching pain. No dizziness, near syncope or syncope. NO bleeding or signs of bleeding Lead outputs were programmed to chronic setting, no other programming changes were made AMS episodes mostly PACs brief AT/VP episodes Left DDD Planned for 47mo visit.  She had COVID in the interim, some medicines adjusted with her PMD.  Device clinic received alert for increased AF burden, 21%, pt was compliant with xarelto and without symptoms, given overdue for clinic visit given an appt to come in and be seen.  TODAY She is accompanied by her daughter today Makayla Atkins) who translates and helps with the visit. She has chronic diffuse body aches/pains no specific CP. The patient points for me to he shoulders, hips knees, that give her pain. No reports of SOB No palpitations no near syncope or syncope.  No bleeding or signs of bleeding Her daughter mentions when she was on 20mg  of xarelto her gums would bleed, but has none on the 15mg   dose.  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     Cholecalciferol (VITAMIN D) 125 MCG (5000 UT) CAPS Take 5,000 Units by mouth daily after lunch.     metoprolol succinate (TOPROL XL) 25 MG 24 hr tablet Take 1 tablet (25 mg total) by mouth daily. 90 tablet 3   polyvinyl alcohol (LIQUIFILM TEARS) 1.4 % ophthalmic solution Place 1 drop into both eyes as needed for dry eyes.     atorvastatin (LIPITOR) 40 MG tablet Take 1 tablet (40 mg total) by mouth daily. Please schedule appointment for future refills. Thank you 90 tablet 0   Rivaroxaban (XARELTO) 15 MG TABS tablet Take 1 tablet (15 mg total) by mouth daily with supper. 90 tablet 1   No current facility-administered medications for this visit.    Allergies:   Patient has no known allergies.   Social History:  The patient  reports that she has never smoked. She has never used smokeless tobacco. She reports that she does not drink alcohol and does not use drugs.   Family History:  No known particular family medical history  ROS:  Please see the history of present illness.    All other systems are reviewed and otherwise negative.   PHYSICAL EXAM:  VS:  BP 120/72   Pulse 62   Ht 5\' 4"  (1.626 m)   Wt 159 lb 12.8 oz (72.5 kg)   SpO2 95%   BMI 27.43 kg/m  BMI: Body mass index is 27.43 kg/m. Well nourished, well developed,  in no acute distress HEENT: normocephalic, atraumatic Neck: no JVD, carotid bruits or masses Cardiac:  RRR; no significant murmurs, no rubs, or gallops Lungs:   CTA b/l, no wheezing, rhonchi or rales Abd: soft, nontender MS: no deformity or atrophy Ext: no edema Skin: warm and dry, no rash Neuro:  No gross deficits appreciated Psych: euthymic mood, full affect  PPM site is stable, no tethering or discomfort   EKG:  not done today  Device interrogation done today and reviewed by myself:  Battery and lead measurements are good AF burden 21% This was largely several days in August that has since slowed down Much of her AMS EGMs today are brief AT/ST  episodes  AP 58% VP 99%  Underlying is SB 50's with 1st degree about September  08/30/2019: TTE IMPRESSIONS   1. Left ventricular ejection fraction, by visual estimation, is 60 to  65%. The left ventricle has normal function. There is severely increased  left ventricular hypertrophy.   2. Asymmetric basal septal hypertrophy measuring up to 90mm (31mm in  lateral wall)   3. Average global longitudinal strain is -15.4%   4. Global right ventricle has normal systolic function.The right  ventricular size is normal. No increase in right ventricular wall  thickness.   5. Severe mitral annular calcification.   6. The mitral valve is abnormal. Trace mitral valve regurgitation.   7. The tricuspid valve is normal in structure. Tricuspid valve  regurgitation is mild.   8. The aortic valve was not well visualized. Aortic valve regurgitation  is not visualized.   9. Severely elevated pulmonary artery systolic pressure.  10. Left atrial size was moderately dilated.  11. Right atrial size was normal.  12. Trivial pericardial effusion is present.  13. The inferior vena cava is dilated in size with <50% respiratory  variability, suggesting right atrial pressure of 15 mmHg.  14. The tricuspid regurgitant velocity is 4.02 m/s, and with an assumed   right atrial pressure of 15 mmHg, the estimated right ventricular systolic  pressure is severely elevated at 79.6 mmHg.     Recent Labs: 11/22/2020: ALT 38; Hemoglobin 10.6; Platelets 153 12/09/2020: BUN 17; Creatinine, Ser 1.10; Potassium 4.5; Sodium 143  11/20/2020: Triglycerides 151   CrCl cannot be calculated (Patient's most recent lab result is older than the maximum 21 days allowed.).   Wt Readings from Last 3 Encounters:  07/28/21 159 lb 12.8 oz (72.5 kg)  12/09/20 153 lb 9.6 oz (69.7 kg)  11/22/20 156 lb 12 oz (71.1 kg)     Other studies reviewed: Additional studies/records reviewed today include: summarized above  ASSESSMENT AND PLAN:  1. PPM     Intact function     No programming chamges made       2. AFlutter, persistent Afib 3. Atach     CHA2DS2Vasc is  4,  on  [place], labs today  Add Toprol 25mg  daily        4. HTN     Not on an BP med currently     Has room for BB   Disposition: F/u with Q59mo remotes, in clinic in 28mo, sooner if needed  Current medicines are reviewed at length with the patient today.  The patient did not have any concerns regarding medicines.  5mo, PA-C 07/28/2021 1:15 PM     [facility] [address] [place] [phone] (office)  [phone] (fax)

## 2021-07-28 ENCOUNTER — Ambulatory Visit (INDEPENDENT_AMBULATORY_CARE_PROVIDER_SITE_OTHER): Payer: Self-pay | Admitting: Physician Assistant

## 2021-07-28 ENCOUNTER — Encounter: Payer: Self-pay | Admitting: Internal Medicine

## 2021-07-28 ENCOUNTER — Ambulatory Visit (HOSPITAL_COMMUNITY)
Admission: RE | Admit: 2021-07-28 | Discharge: 2021-07-28 | Disposition: A | Discharge status: Home / Self Care | Payer: Self-pay | Source: Ambulatory Visit | Attending: Internal Medicine | Admitting: Internal Medicine

## 2021-07-28 ENCOUNTER — Ambulatory Visit (INDEPENDENT_AMBULATORY_CARE_PROVIDER_SITE_OTHER): Payer: Self-pay | Admitting: Internal Medicine

## 2021-07-28 ENCOUNTER — Other Ambulatory Visit: Payer: Self-pay

## 2021-07-28 ENCOUNTER — Encounter: Payer: Self-pay | Admitting: Physician Assistant

## 2021-07-28 VITALS — BP 120/72 | HR 62 | Ht 64.0 in | Wt 159.8 lb

## 2021-07-28 VITALS — BP 146/84 | HR 63 | Temp 97.7°F | Ht 64.0 in | Wt 159.0 lb

## 2021-07-28 DIAGNOSIS — K219 Gastro-esophageal reflux disease without esophagitis: Secondary | ICD-10-CM

## 2021-07-28 DIAGNOSIS — I442 Atrioventricular block, complete: Secondary | ICD-10-CM

## 2021-07-28 DIAGNOSIS — I10 Essential (primary) hypertension: Secondary | ICD-10-CM

## 2021-07-28 DIAGNOSIS — Z79899 Other long term (current) drug therapy: Secondary | ICD-10-CM

## 2021-07-28 DIAGNOSIS — Z950 Presence of cardiac pacemaker: Secondary | ICD-10-CM

## 2021-07-28 DIAGNOSIS — R0781 Pleurodynia: Secondary | ICD-10-CM | POA: Insufficient documentation

## 2021-07-28 DIAGNOSIS — I483 Typical atrial flutter: Secondary | ICD-10-CM

## 2021-07-28 DIAGNOSIS — E785 Hyperlipidemia, unspecified: Secondary | ICD-10-CM

## 2021-07-28 DIAGNOSIS — M1710 Unilateral primary osteoarthritis, unspecified knee: Secondary | ICD-10-CM

## 2021-07-28 DIAGNOSIS — I4819 Other persistent atrial fibrillation: Secondary | ICD-10-CM

## 2021-07-28 LAB — CUP PACEART INCLINIC DEVICE CHECK
Battery Remaining Longevity: 70 mo
Battery Voltage: 2.99 V
Brady Statistic RA Percent Paced: 58 %
Brady Statistic RV Percent Paced: 99 %
Date Time Interrogation Session: 20221005134727
Implantable Lead Implant Date: 20201107
Implantable Lead Implant Date: 20201107
Implantable Lead Location: 753859
Implantable Lead Location: 753860
Implantable Pulse Generator Implant Date: 20201107
Lead Channel Impedance Value: 437.5 Ohm
Lead Channel Impedance Value: 512.5 Ohm
Lead Channel Pacing Threshold Amplitude: 0.75 V
Lead Channel Pacing Threshold Amplitude: 0.75 V
Lead Channel Pacing Threshold Amplitude: 1 V
Lead Channel Pacing Threshold Amplitude: 1 V
Lead Channel Pacing Threshold Pulse Width: 0.5 ms
Lead Channel Pacing Threshold Pulse Width: 0.5 ms
Lead Channel Pacing Threshold Pulse Width: 0.5 ms
Lead Channel Pacing Threshold Pulse Width: 0.5 ms
Lead Channel Sensing Intrinsic Amplitude: 12 mV
Lead Channel Sensing Intrinsic Amplitude: 4.7 mV
Lead Channel Setting Pacing Amplitude: 2 V
Lead Channel Setting Pacing Amplitude: 2.5 V
Lead Channel Setting Pacing Pulse Width: 0.5 ms
Lead Channel Setting Sensing Sensitivity: 4 mV
Pulse Gen Model: 2272
Pulse Gen Serial Number: 9175795

## 2021-07-28 LAB — CBC
Hematocrit: 37.1 % (ref 34.0–46.6)
Hemoglobin: 12.7 g/dL (ref 11.1–15.9)
MCH: 29.5 pg (ref 26.6–33.0)
MCHC: 34.2 g/dL (ref 31.5–35.7)
MCV: 86 fL (ref 79–97)
Platelets: 147 10*3/uL — ABNORMAL LOW (ref 150–450)
RBC: 4.31 x10E6/uL (ref 3.77–5.28)
RDW: 13.2 % (ref 11.7–15.4)
WBC: 4.6 10*3/uL (ref 3.4–10.8)

## 2021-07-28 LAB — COMPREHENSIVE METABOLIC PANEL
ALT: 20 IU/L (ref 0–32)
AST: 41 IU/L — ABNORMAL HIGH (ref 0–40)
Albumin/Globulin Ratio: 1.7 (ref 1.2–2.2)
Albumin: 4.5 g/dL (ref 3.6–4.6)
Alkaline Phosphatase: 82 IU/L (ref 44–121)
BUN/Creatinine Ratio: 19 (ref 12–28)
BUN: 22 mg/dL (ref 8–27)
Bilirubin Total: 1.1 mg/dL (ref 0.0–1.2)
CO2: 26 mmol/L (ref 20–29)
Calcium: 9.8 mg/dL (ref 8.7–10.3)
Chloride: 105 mmol/L (ref 96–106)
Creatinine, Ser: 1.17 mg/dL — ABNORMAL HIGH (ref 0.57–1.00)
Globulin, Total: 2.6 g/dL (ref 1.5–4.5)
Glucose: 103 mg/dL — ABNORMAL HIGH (ref 70–99)
Potassium: 4.2 mmol/L (ref 3.5–5.2)
Sodium: 145 mmol/L — ABNORMAL HIGH (ref 134–144)
Total Protein: 7.1 g/dL (ref 6.0–8.5)
eGFR: 45 mL/min/{1.73_m2} — ABNORMAL LOW (ref >59)

## 2021-07-28 LAB — LIPID PANEL
Chol/HDL Ratio: 8.1 ratio — ABNORMAL HIGH (ref 0.0–4.4)
Cholesterol, Total: 228 mg/dL — ABNORMAL HIGH (ref 100–199)
HDL: 28 mg/dL — ABNORMAL LOW (ref >39)
LDL Chol Calc (NIH): 156 mg/dL — ABNORMAL HIGH (ref 0–99)
Triglycerides: 235 mg/dL — ABNORMAL HIGH (ref 0–149)
VLDL Cholesterol Cal: 44 mg/dL — ABNORMAL HIGH (ref 5–40)

## 2021-07-28 MED ORDER — METOPROLOL SUCCINATE ER 25 MG PO TB24: 25.0000 mg | ORAL_TABLET | Freq: Every day | ORAL | 3 refills | Status: AC

## 2021-07-28 MED ORDER — RIVAROXABAN 15 MG PO TABS: 15.0000 mg | ORAL_TABLET | Freq: Every day | ORAL | 1 refills | Status: DC

## 2021-07-28 MED ORDER — OMEPRAZOLE MAGNESIUM 20 MG PO TBEC: 20.0000 mg | DELAYED_RELEASE_TABLET | Freq: Every day | ORAL | 1 refills | Status: AC

## 2021-07-28 MED ORDER — ATORVASTATIN CALCIUM 40 MG PO TABS: 40.0000 mg | ORAL_TABLET | Freq: Every day | ORAL | 0 refills | Status: AC

## 2021-07-28 MED ORDER — DICLOFENAC SODIUM 1 % EX GEL: 2.0000 g | Freq: Four times a day (QID) | CUTANEOUS | 1 refills | Status: AC | PRN

## 2021-07-28 NOTE — Assessment & Plan Note (Signed)
Patient was seen in cardiology clinic this morning.  Patient was started on metoprolol 25 mg daily for runs of a flutter detected on device.  Blood pressure this morning in cardiology clinic 120/72.  Today in [facility] patient's blood pressure initially 160/92 with repeat blood pressure 146/84.  Patient has yet to start metoprolol 25 mg daily, therefore we will hold off on making any antihypertensive medication changes today.  We will reevaluate the patient is able to come in for an in person visit.  Her next appointment will need to be a telehealth visit given she is traveling to [place] for the next several weeks.

## 2021-07-28 NOTE — Assessment & Plan Note (Addendum)
Patient presents to clinic today with acute complaint of chest pain with deep breathing for the last 2 weeks.  When patient is asked to point to where she is having pain she points to the lower sternum upper epigastric area.  She denies chest pain at rest, chest pain on exertion, orthopnea, cough, fever.  She did endorse shortness of breath on exertion that patient has been experiencing prior to chest pain.  Patient met with cardiology at an appointment earlier today and patient was started on metoprolol for runs of A. fib detected on device.  Her daughter states that she complains that it feels as though she will throw up when she lies back after eating dinner.  She denies vomiting, though frequently feels the need to spit.  On exam, lungs clear to auscultation and patient has chest wall tenderness to palpation on lower sternum as well as epigastric tenderness to palpation.  Etiology more likely chest pain related to GERD.  Also on the differential were respiratory or cardiac etiologies such as pneumonia, pleural effusion, congestive heart failure.  Patient shortness of breath on exertion could be due to heart failure exacerbation, a flutter, long COVID, anemia. -Omeprazole 20 mg daily -CBC to check hemoglobin -Chest x-ray to rule out other respiratory etiologies -Follow-up telehealth visit in 2 to 4 weeks  Addendum: Patient's chest x-ray without acute abnormalities.  Patient's family member who provides interpretation was called and updated with result on 10/6.

## 2021-07-28 NOTE — Assessment & Plan Note (Signed)
Patient has a history of hyperlipidemia.  Last LDL from 2020 111.  Patient currently on atorvastatin 40 mg daily.  Lipid panel was ordered by cardiology clinic at cardiology appointment earlier today.  We will continue on 40 mg atorvastatin daily and follow-up lipid panel during next telehealth visit in 2 to 4 weeks.

## 2021-07-28 NOTE — Assessment & Plan Note (Addendum)
Patient endorsing joint pain of multiple joints including shoulders, wrists, finger joints, left knee.  Patient endorses pain throughout the day that is not worse in the morning or in the evening.  Patient has stiffness throughout the day that does not improve as the day goes on.  On exam patient has swollen wrists as well as swollen left knee, though none of these joints are hot/erythematous.  Given exam, clinical presentation, and given patient's age this is more likely osteoarthritis.  Currently patient has only tried Motrin intermittently for pain.  She has not tried scheduled Tylenol or Voltaren gel. Plan: -Scheduled Tylenol 1000 mg 3 times a day -Voltaren gel as needed

## 2021-07-28 NOTE — Progress Notes (Addendum)
Internal Medicine Clinic Attending  I saw and evaluated the patient.  I personally confirmed the key portions of the history and exam documented by Dr. Sharene Butters and I reviewed pertinent patient test results.  The assessment, diagnosis, and plan were formulated together and I agree with the documentation in the resident's note.  Longitudinal melonychia noted over thumbnail, however unable to assess during this visit. Attempted to call patient's daughter/patient to discuss further but unable to reach. Will continue to reach out and/or discuss at follow-up visit in two weeks.

## 2021-07-28 NOTE — Patient Instructions (Signed)
Thank you, Ms.Saniah Dunigan for allowing [place] to provide your care today. Today we discussed joint pain and chest pain with deep breathing.  Joint pain: You likely have osteoarthritis of your joints.  Please take Tylenol 1000 mg 3 times a day.  We have also prescribed Voltaren gel that can be applied topically to the joints up to 4 times a day.  Chest pain with deep breathing/upper abdominal pain: We feel this pain could be related to gastric reflux.  We will try omeprazole 20 mg once daily for reflux.  We will also get a chest x-ray today to make sure were not missing something.  My Chart Access: [email or web address]  Please follow-up in in 2-4 weeks.  We will arrange for a telehealth visit.  Our office will call you to set up the appointment.  One of our providers will call you to check in to see how you are doing.   We look forward to seeing you next time. Please call our clinic at [phone] if you have any questions or concerns. The best time to call is Monday-Friday from 9am-4pm, but there is someone available 24/7. If after hours or the weekend, call the main hospital number and ask for the Internal Medicine Resident On-Call. If you need medication refills, please notify your pharmacy one week in advance and they will send [place] a request.   Thank you for letting us take part in your care. Wishing you the best!  Ellison Carwin, MD 07/28/2021, 4:23 PM IM Resident, PGY-1

## 2021-07-28 NOTE — Patient Instructions (Addendum)
Medication Instructions:    START TAKING TOPROL XL 25 MG ONCE A DAY    *If you need a refill on your cardiac medications before your next appointment, please call your pharmacy*   Lab Work:  TODAY CMET LIPID AND CBC   If you have labs (blood work) drawn today and your tests are completely normal, you will receive your results only by: MyChart Message (if you have MyChart) OR A paper copy in the mail If you have any lab test that is abnormal or we need to change your treatment, we will call you to review the results.   Testing/Procedures: NONE ORDERED  TODAY   Follow-Up: At [facility], you and your health needs are our priority.  As part of our continuing mission to provide you with exceptional heart care, we have created designated Provider Care Teams.  These Care Teams include your primary Cardiologist (physician) and Advanced Practice Providers (APPs -  Physician Assistants and Nurse Practitioners) who all work together to provide you with the care you need, when you need it.  We recommend signing up for the patient portal called "MyChart".  Sign up information is provided on this After Visit Summary.  MyChart is used to connect with patients for Virtual Visits (Telemedicine).  Patients are able to view lab/test results, encounter notes, upcoming appointments, etc.  Non-urgent messages can be sent to your provider as well.   To learn more about what you can do with MyChart, go to [email or web address].    Your next appointment:   6 month(s)  The format for your next appointment:   In Person  Provider:   You may see Hillis Range, MD or one of the following Advanced Practice Providers on your designated Care Team:   Francis Dowse, [place]    Other Instructions

## 2021-07-28 NOTE — Progress Notes (Signed)
  CC: Joint pain, chest pain with deep breathing  HPI:  [name] is a 87 y.o. female with a past medical history stated below and presents today for routine health checkup with acute concerns of joint pain and chest pain with deep breathing. Please see problem based assessment and plan for additional details.  Patient's daughter is present for visit and acts as interpreter.  Patient speaks a dialect from [place] called Pinyin that is not available through our interpretation services.  Past Medical History:  Diagnosis Date   Atrial flutter (HCC)    Complete heart block (HCC)    Glaucoma    Hx COVID-19 infection 11/21/2020   Hyperlipidemia    Hypertension     Current Outpatient Medications on File Prior to Visit  Medication Sig Dispense Refill   acetaminophen (TYLENOL) 325 MG tablet Take 650 mg by mouth every 6 (six) hours as needed for mild pain.     atorvastatin (LIPITOR) 40 MG tablet Take 1 tablet (40 mg total) by mouth daily. Please schedule appointment for future refills. Thank you 90 tablet 0   Cholecalciferol (VITAMIN D) 125 MCG (5000 UT) CAPS Take 5,000 Units by mouth daily after lunch.     metoprolol succinate (TOPROL XL) 25 MG 24 hr tablet Take 1 tablet (25 mg total) by mouth daily. 90 tablet 3   polyvinyl alcohol (LIQUIFILM TEARS) 1.4 % ophthalmic solution Place 1 drop into both eyes as needed for dry eyes.     Rivaroxaban (XARELTO) 15 MG TABS tablet Take 1 tablet (15 mg total) by mouth daily with supper. 90 tablet 1   [DISCONTINUED] lisinopril (ZESTRIL) 40 MG tablet TAKE 1 TABLET BY MOUTH DAILY (Patient taking differently: Take 40 mg by mouth at bedtime.) 30 tablet 5   No current facility-administered medications on file prior to visit.    Social History: Patient lives in [place], [place] with RW.  Will be traveling to visit daughter in [place] for the next several weeks.  Review of Systems: ROS negative except for what is noted on the assessment and  plan.  Vitals:   07/28/21 1519 07/28/21 1621  BP: (!) 160/92 (!) 146/84  Pulse: 71 63  Temp: 97.7 F (36.5 C)   TempSrc: Oral   SpO2: 99%   Weight: 159 lb (72.1 kg)   Height: 5\' 4"  (1.626 m)      Physical Exam: General: Elderly African-American female, NAD HENT: normocephalic, atraumatic EYES: conjunctiva non-erythematous, no scleral icterus CV: regular rate, normal rhythm, no murmurs, rubs, gallops.  Chest wall tenderness to palpation on lower sternum. Pulmonary: Normal work of breathing on RA, lungs clear to auscultation, no rales, wheezes, rhonchi Abdominal: non-distended, soft, tenderness to palpation in epigastric area, normal BS Skin: Warm and dry, no rashes or lesions MSK: Tenderness to palpation of bilateral shoulders, wrists, several finger joints, left knee.  Swelling of bilateral wrists as well as left knee.  No hot/erythematous joints. Neurological: MS: awake, alert and oriented x3, normal speech and fund of knowledge Motor: moves all extremities antigravity Psych: normal affect    Assessment & Plan:   See Encounters Tab for problem based charting.  Patient seen with Dr.  , M.D. [facility] Internal Medicine, PGY-1 Pager: [phone] Date 07/28/2021 Time 4:34 PM

## 2021-07-30 ENCOUNTER — Telehealth: Payer: Self-pay | Admitting: Internal Medicine

## 2021-07-30 NOTE — Telephone Encounter (Signed)
Able to reach patient's daughter, Ifeoma Vallin, with whom patient is now living in [place]. [place] and whom interprets for the patient. 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 Encouraged evaluation with local dermatology which she acknowledged. Also discussed calling to schedule telephone follow-up which she will do.

## 2021-08-18 ENCOUNTER — Other Ambulatory Visit (HOSPITAL_COMMUNITY): Payer: Self-pay

## 2021-08-18 ENCOUNTER — Other Ambulatory Visit: Payer: Self-pay | Admitting: Internal Medicine

## 2021-08-19 ENCOUNTER — Other Ambulatory Visit (HOSPITAL_COMMUNITY): Payer: Self-pay

## 2021-08-26 ENCOUNTER — Telehealth: Payer: Self-pay | Admitting: Neurology

## 2021-08-26 ENCOUNTER — Other Ambulatory Visit (HOSPITAL_COMMUNITY): Payer: Self-pay

## 2021-08-26 MED ORDER — RIVAROXABAN 15 MG PO TABS
15.0000 mg | ORAL_TABLET | Freq: Every day | ORAL | 1 refills | Status: AC
  Filled 2021-08-26 – 2021-08-27 (×2): qty 30, 30d supply, fill #0

## 2021-08-26 NOTE — Telephone Encounter (Signed)
Patient calling to get refill on Xarelto sent to    [facility] outpatient pharmacy. Patient said that it was sent to [facility] needs to be resent to [facility] outpatient pharmacy.

## 2021-08-26 NOTE — Telephone Encounter (Signed)
Prescription refill request for Xarelto received.  Indication: aflutter Last office visit: 07/28/2021 Weight: 72.1 kg  Age: 87 yo  Scr: 1.17, 07/28/2021 CrCl: 37 ml/min   Refill sent.

## 2021-08-27 ENCOUNTER — Telehealth: Payer: Self-pay | Admitting: *Deleted

## 2021-08-27 ENCOUNTER — Other Ambulatory Visit (HOSPITAL_COMMUNITY): Payer: Self-pay

## 2021-08-27 ENCOUNTER — Telehealth: Payer: Self-pay | Admitting: Internal Medicine

## 2021-08-27 NOTE — Telephone Encounter (Signed)
Spoke with son about the Different cost of Blood thinners prices. Eliquis is $540 Xarelto $ 535 $ Wafarin $17.42. Son stated mother is in [place] with sister to help him with taking care of her . Son stated he not for sure when she is returning. Son wants to know if there is a way to get mother back in program to help afford medications or give two months of sample. Patient son was explained that we do not give out samples and will do some research to see about the program. Patient son was told he will be contacted back Monday with whatever more information I can find out.

## 2021-08-27 NOTE — Telephone Encounter (Signed)
Pt c/o medication issue:  1. Name of Medication: Rivaroxaban (XARELTO) 15 MG TABS tablet  2. How are you currently taking this medication (dosage and times per day)?   3. Are you having a reaction (difficulty breathing--STAT)?   4. What is your medication issue? TRISH FROM [facility] STATES THIS PT DOES NOT HAVE ANY INSURANCE, THIS SCRIPT NEEDS TO COME FROM [facility] FOR IT TO BE BILLED CORRECTLY. TRISH CAN BE REACHED [phone] SHE WILL BE IN THE PHARMACY TODAY UNTIL 6PM

## 2021-08-27 NOTE — Telephone Encounter (Signed)
Spoke to the pharmacy and they advised me that they contacted the patients son and spoke to him about the IM Program and that the prescription would need to come from one of the IM doctors to use that discount program. It could not be used under Dr. Johney Frame. The staff member said the son was going to call IM and she will follow up next week.

## 2021-08-30 NOTE — Telephone Encounter (Signed)
Spoke with patient son who stated that he remembered that the Internal Medicine Clinic gave him some  Xarelto samples for his mother. Patient son stated mother is returning to [facility] next month and will call clinic back to set up appointment for mother to continue her mgt of blood thinner.

## 2021-09-09 LAB — CUP PACEART REMOTE DEVICE CHECK
Battery Remaining Longevity: 73 mo
Battery Remaining Percentage: 72 %
Battery Voltage: 2.99 V
Brady Statistic AP VP Percent: 80 %
Brady Statistic AP VS Percent: 1 %
Brady Statistic AS VP Percent: 19 %
Brady Statistic AS VS Percent: 1 %
Brady Statistic RA Percent Paced: 66 %
Brady Statistic RV Percent Paced: 96 %
Date Time Interrogation Session: 20221117020014
Implantable Lead Implant Date: 20201107
Implantable Lead Implant Date: 20201107
Implantable Lead Location: 753859
Implantable Lead Location: 753860
Implantable Pulse Generator Implant Date: 20201107
Lead Channel Impedance Value: 480 Ohm
Lead Channel Impedance Value: 490 Ohm
Lead Channel Pacing Threshold Amplitude: 0.75 V
Lead Channel Pacing Threshold Amplitude: 1 V
Lead Channel Pacing Threshold Pulse Width: 0.5 ms
Lead Channel Pacing Threshold Pulse Width: 0.5 ms
Lead Channel Sensing Intrinsic Amplitude: 12 mV
Lead Channel Sensing Intrinsic Amplitude: 4 mV
Lead Channel Setting Pacing Amplitude: 2 V
Lead Channel Setting Pacing Amplitude: 2.5 V
Lead Channel Setting Pacing Pulse Width: 0.5 ms
Lead Channel Setting Sensing Sensitivity: 4 mV
Pulse Gen Model: 2272
Pulse Gen Serial Number: 9175795

## 2021-09-10 ENCOUNTER — Ambulatory Visit (INDEPENDENT_AMBULATORY_CARE_PROVIDER_SITE_OTHER): Payer: Self-pay

## 2021-09-10 DIAGNOSIS — I442 Atrioventricular block, complete: Secondary | ICD-10-CM

## 2021-09-20 NOTE — Progress Notes (Signed)
Remote pacemaker transmission.   

## 2021-09-21 IMAGING — CR DG WRIST COMPLETE 3+V*R*
4 series · 4 of 4 positions shown · non-contrast
Comparison: None.

CLINICAL DATA: Acute right wrist pain without known injury.

EXAM:
RIGHT WRIST - COMPLETE 3+ VIEW

[wrist pa]
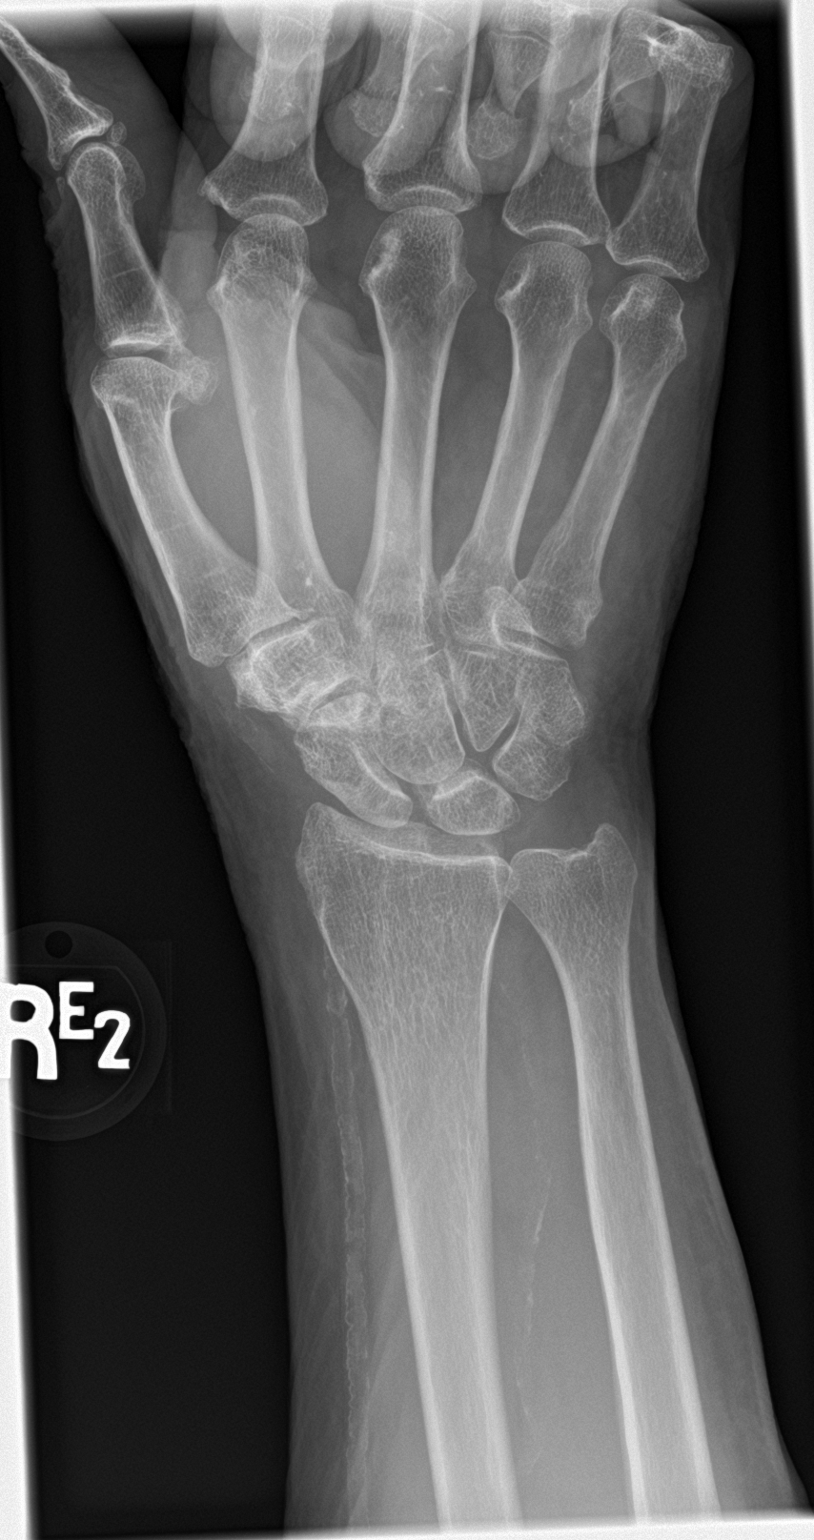

[wrist obl]
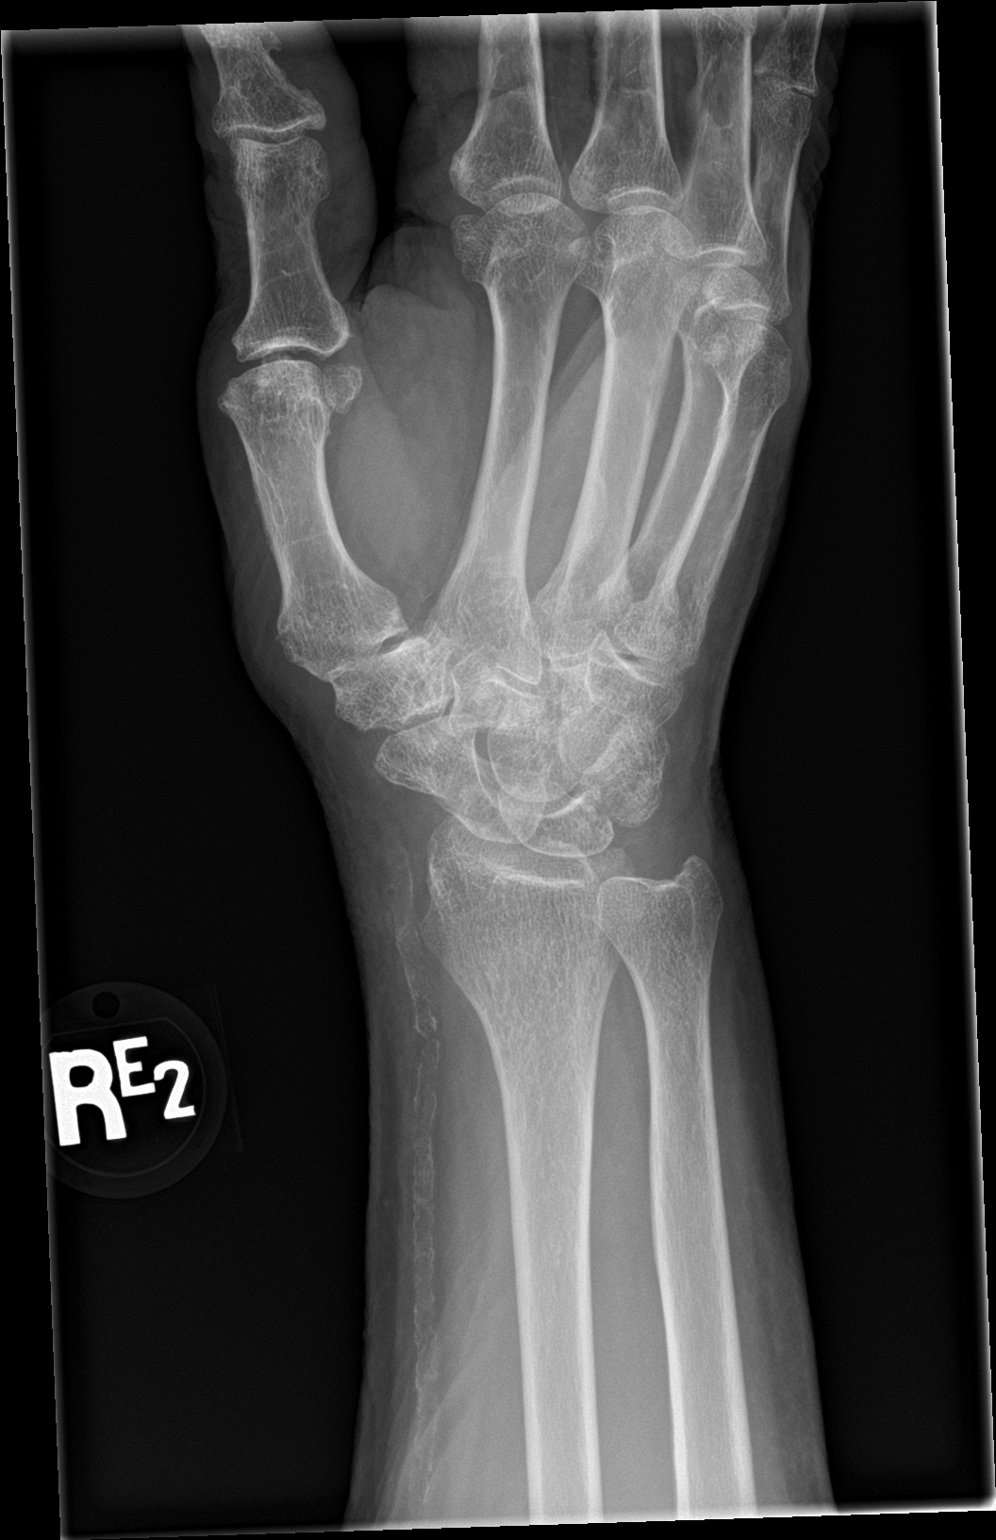

[wrist lat]
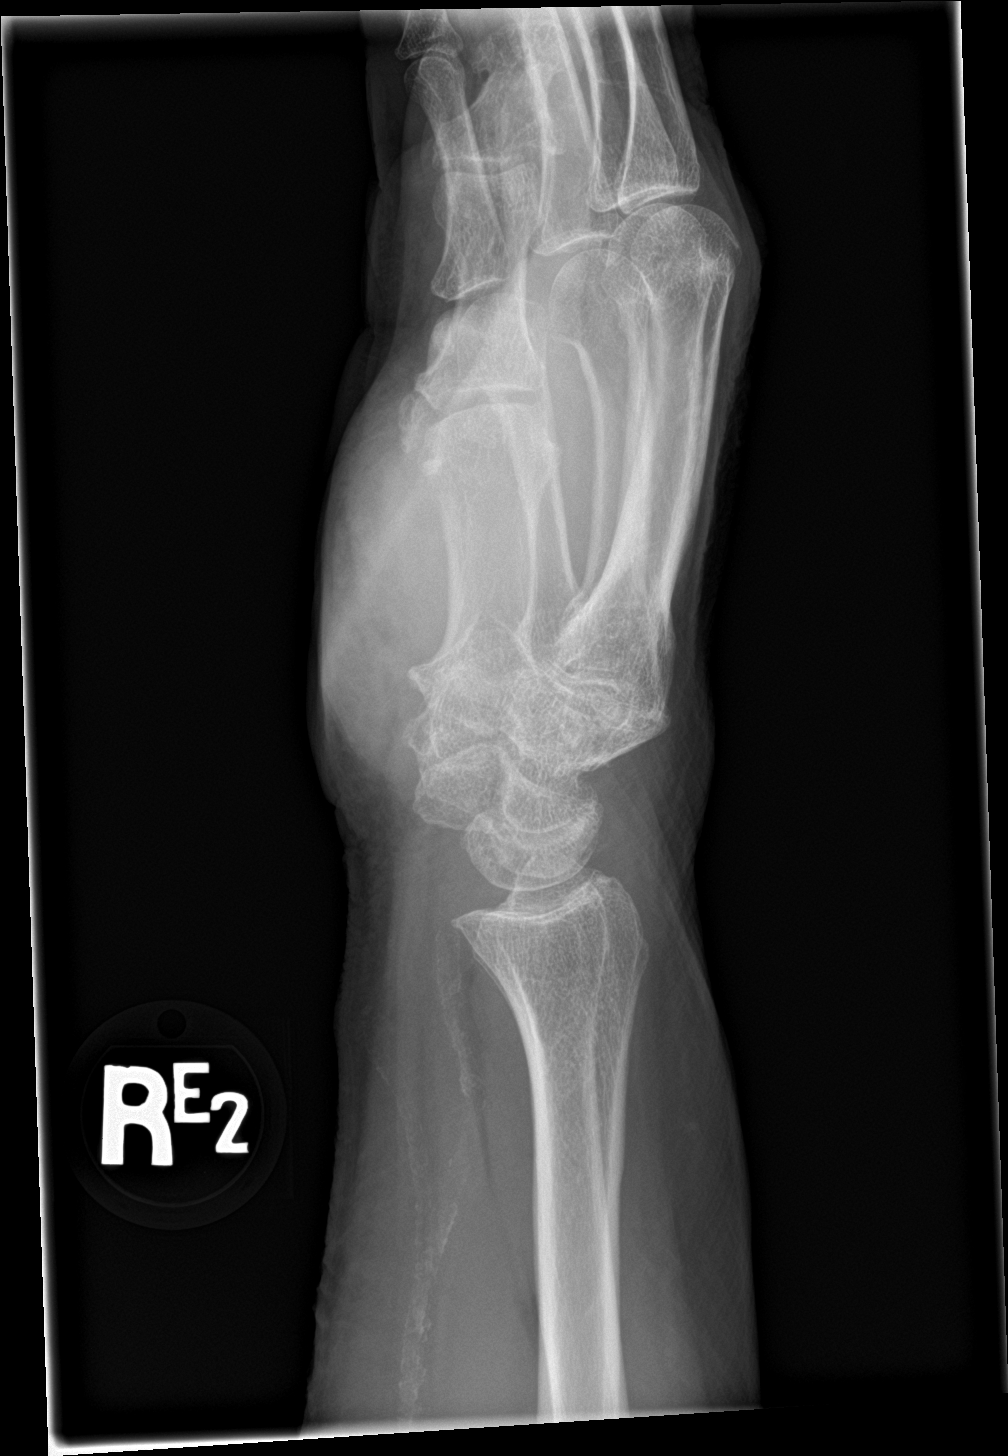

[wrist navicular]
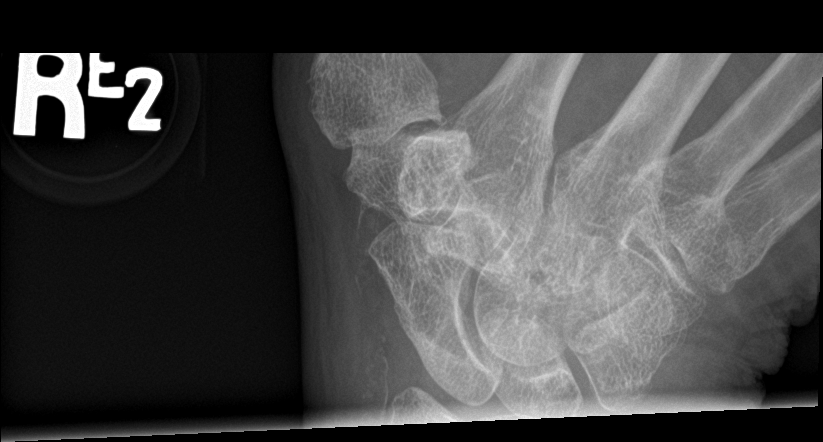

[4 of 4 positions shown; findings below may reference images not displayed]

FINDINGS: There is no evidence of fracture or dislocation. There is no
evidence of arthropathy or other focal bone abnormality. Soft
tissues are unremarkable.
IMPRESSION: Negative.

## 2021-12-10 ENCOUNTER — Ambulatory Visit (INDEPENDENT_AMBULATORY_CARE_PROVIDER_SITE_OTHER): Payer: Self-pay

## 2021-12-10 DIAGNOSIS — I442 Atrioventricular block, complete: Secondary | ICD-10-CM

## 2021-12-11 LAB — CUP PACEART REMOTE DEVICE CHECK
Battery Remaining Longevity: 70 mo
Battery Remaining Percentage: 70 %
Battery Voltage: 2.99 V
Brady Statistic AP VP Percent: 82 %
Brady Statistic AP VS Percent: 1 %
Brady Statistic AS VP Percent: 18 %
Brady Statistic AS VS Percent: 1 %
Brady Statistic RA Percent Paced: 74 %
Brady Statistic RV Percent Paced: 98 %
Date Time Interrogation Session: 20230217184941
Implantable Lead Implant Date: 20201107
Implantable Lead Implant Date: 20201107
Implantable Lead Location: 753859
Implantable Lead Location: 753860
Implantable Pulse Generator Implant Date: 20201107
Lead Channel Impedance Value: 480 Ohm
Lead Channel Impedance Value: 490 Ohm
Lead Channel Pacing Threshold Amplitude: 0.75 V
Lead Channel Pacing Threshold Amplitude: 1 V
Lead Channel Pacing Threshold Pulse Width: 0.5 ms
Lead Channel Pacing Threshold Pulse Width: 0.5 ms
Lead Channel Sensing Intrinsic Amplitude: 12 mV
Lead Channel Sensing Intrinsic Amplitude: 4.1 mV
Lead Channel Setting Pacing Amplitude: 2 V
Lead Channel Setting Pacing Amplitude: 2.5 V
Lead Channel Setting Pacing Pulse Width: 0.5 ms
Lead Channel Setting Sensing Sensitivity: 4 mV
Pulse Gen Model: 2272
Pulse Gen Serial Number: 9175795

## 2021-12-16 NOTE — Progress Notes (Signed)
Remote pacemaker transmission.   

## 2021-12-31 IMAGING — DX DG CHEST 1V PORT
1 series · 1 of 1 positions shown · non-contrast
Comparison: 09/01/2011

CLINICAL DATA: Altered mental status

EXAM:
PORTABLE CHEST 1 VIEW

[chest]
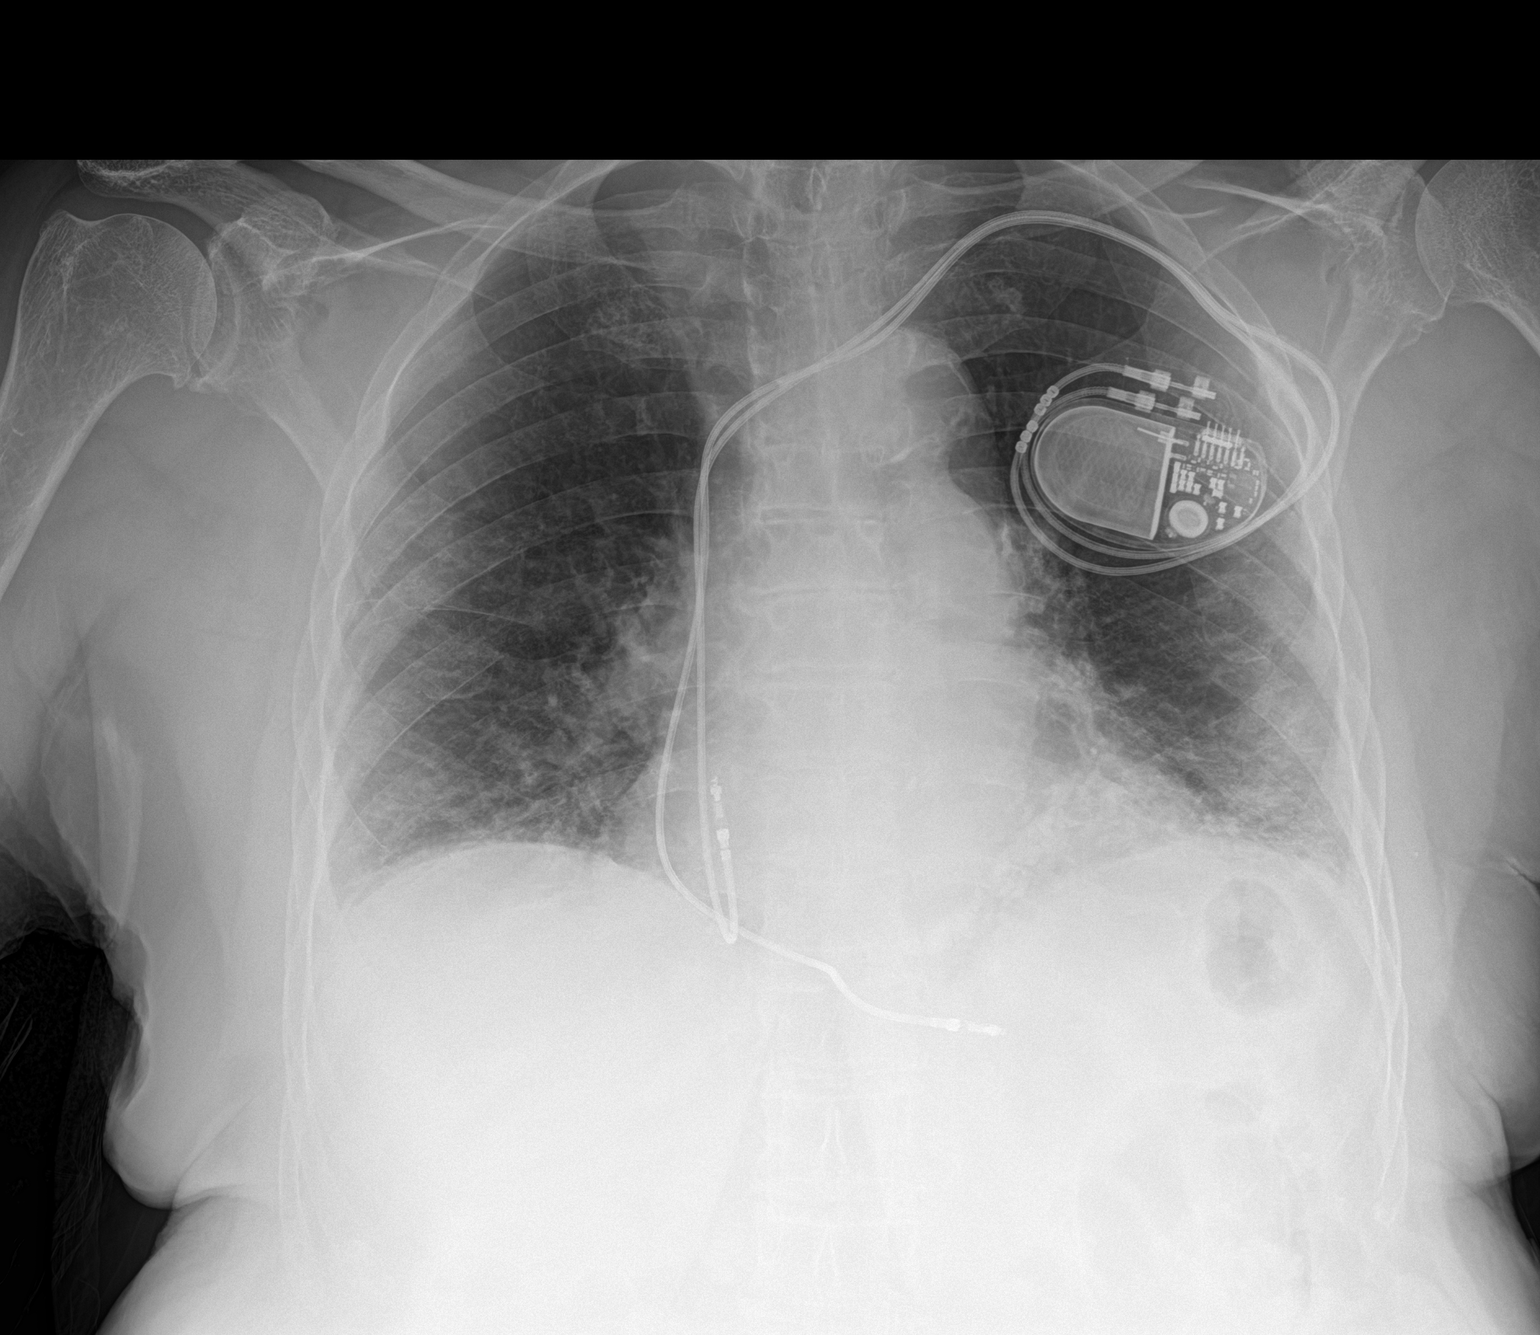

[1 of 1 positions shown; findings below may reference images not displayed]

FINDINGS: Left pacer remains in place, unchanged. Left base atelectasis or
infiltrate. No confluent opacity on the right. Heart is normal size.
No effusions or acute bony abnormality.
IMPRESSION: Left base opacity could reflect atelectasis or infiltrate.

## 2022-03-11 ENCOUNTER — Ambulatory Visit (INDEPENDENT_AMBULATORY_CARE_PROVIDER_SITE_OTHER): Payer: Self-pay

## 2022-03-11 DIAGNOSIS — I442 Atrioventricular block, complete: Secondary | ICD-10-CM

## 2022-03-11 LAB — CUP PACEART REMOTE DEVICE CHECK
Battery Remaining Longevity: 68 mo
Battery Remaining Percentage: 67 %
Battery Voltage: 2.99 V
Brady Statistic AP VP Percent: 81 %
Brady Statistic AP VS Percent: 1 %
Brady Statistic AS VP Percent: 18 %
Brady Statistic AS VS Percent: 1 %
Brady Statistic RA Percent Paced: 76 %
Brady Statistic RV Percent Paced: 99 %
Date Time Interrogation Session: 20230519020033
Implantable Lead Implant Date: 20201107
Implantable Lead Implant Date: 20201107
Implantable Lead Location: 753859
Implantable Lead Location: 753860
Implantable Pulse Generator Implant Date: 20201107
Lead Channel Impedance Value: 480 Ohm
Lead Channel Impedance Value: 510 Ohm
Lead Channel Pacing Threshold Amplitude: 0.75 V
Lead Channel Pacing Threshold Amplitude: 1 V
Lead Channel Pacing Threshold Pulse Width: 0.5 ms
Lead Channel Pacing Threshold Pulse Width: 0.5 ms
Lead Channel Sensing Intrinsic Amplitude: 12 mV
Lead Channel Sensing Intrinsic Amplitude: 3.6 mV
Lead Channel Setting Pacing Amplitude: 2 V
Lead Channel Setting Pacing Amplitude: 2.5 V
Lead Channel Setting Pacing Pulse Width: 0.5 ms
Lead Channel Setting Sensing Sensitivity: 4 mV
Pulse Gen Model: 2272
Pulse Gen Serial Number: 9175795

## 2022-03-17 NOTE — Progress Notes (Signed)
Remote pacemaker transmission.   

## 2022-04-16 ENCOUNTER — Encounter: Payer: Self-pay | Admitting: *Deleted

## 2022-04-18 ENCOUNTER — Encounter (HOSPITAL_BASED_OUTPATIENT_CLINIC_OR_DEPARTMENT_OTHER): Payer: Self-pay | Admitting: Internal Medicine

## 2022-05-26 ENCOUNTER — Encounter (HOSPITAL_BASED_OUTPATIENT_CLINIC_OR_DEPARTMENT_OTHER): Payer: Self-pay | Admitting: Internal Medicine

## 2022-09-09 ENCOUNTER — Ambulatory Visit (INDEPENDENT_AMBULATORY_CARE_PROVIDER_SITE_OTHER): Payer: Self-pay

## 2022-09-09 DIAGNOSIS — I442 Atrioventricular block, complete: Secondary | ICD-10-CM

## 2022-09-09 DIAGNOSIS — Z950 Presence of cardiac pacemaker: Secondary | ICD-10-CM

## 2022-09-09 LAB — CUP PACEART REMOTE DEVICE CHECK
Battery Remaining Longevity: 61 mo
Battery Remaining Percentage: 61 %
Battery Voltage: 2.99 V
Brady Statistic AP VP Percent: 83 %
Brady Statistic AP VS Percent: 1 %
Brady Statistic AS VP Percent: 17 %
Brady Statistic AS VS Percent: 1 %
Brady Statistic RA Percent Paced: 80 %
Brady Statistic RV Percent Paced: 99 %
Date Time Interrogation Session: 20231117020026
Implantable Lead Connection Status: 753985
Implantable Lead Connection Status: 753985
Implantable Lead Implant Date: 20201107
Implantable Lead Implant Date: 20201107
Implantable Lead Location: 753859
Implantable Lead Location: 753860
Implantable Pulse Generator Implant Date: 20201107
Lead Channel Impedance Value: 450 Ohm
Lead Channel Impedance Value: 490 Ohm
Lead Channel Pacing Threshold Amplitude: 0.75 V
Lead Channel Pacing Threshold Amplitude: 1 V
Lead Channel Pacing Threshold Pulse Width: 0.5 ms
Lead Channel Pacing Threshold Pulse Width: 0.5 ms
Lead Channel Sensing Intrinsic Amplitude: 12 mV
Lead Channel Sensing Intrinsic Amplitude: 4.8 mV
Lead Channel Setting Pacing Amplitude: 2 V
Lead Channel Setting Pacing Amplitude: 2.5 V
Lead Channel Setting Pacing Pulse Width: 0.5 ms
Lead Channel Setting Sensing Sensitivity: 4 mV
Pulse Gen Model: 2272
Pulse Gen Serial Number: 9175795

## 2022-09-28 NOTE — Progress Notes (Signed)
Remote pacemaker transmission.   

## 2022-12-09 ENCOUNTER — Ambulatory Visit (INDEPENDENT_AMBULATORY_CARE_PROVIDER_SITE_OTHER): Payer: Self-pay

## 2022-12-09 DIAGNOSIS — I442 Atrioventricular block, complete: Secondary | ICD-10-CM

## 2022-12-09 LAB — CUP PACEART REMOTE DEVICE CHECK
Battery Remaining Longevity: 58 mo
Battery Remaining Percentage: 58 %
Battery Voltage: 2.99 V
Brady Statistic AP VP Percent: 84 %
Brady Statistic AP VS Percent: 1 %
Brady Statistic AS VP Percent: 16 %
Brady Statistic AS VS Percent: 1 %
Brady Statistic RA Percent Paced: 82 %
Brady Statistic RV Percent Paced: 99 %
Date Time Interrogation Session: 20240216020021
Implantable Lead Connection Status: 753985
Implantable Lead Connection Status: 753985
Implantable Lead Implant Date: 20201107
Implantable Lead Implant Date: 20201107
Implantable Lead Location: 753859
Implantable Lead Location: 753860
Implantable Pulse Generator Implant Date: 20201107
Lead Channel Impedance Value: 440 Ohm
Lead Channel Impedance Value: 480 Ohm
Lead Channel Pacing Threshold Amplitude: 0.75 V
Lead Channel Pacing Threshold Amplitude: 1 V
Lead Channel Pacing Threshold Pulse Width: 0.5 ms
Lead Channel Pacing Threshold Pulse Width: 0.5 ms
Lead Channel Sensing Intrinsic Amplitude: 12 mV
Lead Channel Sensing Intrinsic Amplitude: 4.3 mV
Lead Channel Setting Pacing Amplitude: 2 V
Lead Channel Setting Pacing Amplitude: 2.5 V
Lead Channel Setting Pacing Pulse Width: 0.5 ms
Lead Channel Setting Sensing Sensitivity: 4 mV
Pulse Gen Model: 2272
Pulse Gen Serial Number: 9175795

## 2023-01-03 NOTE — Progress Notes (Signed)
Remote pacemaker transmission.   

## 2023-03-10 ENCOUNTER — Ambulatory Visit (INDEPENDENT_AMBULATORY_CARE_PROVIDER_SITE_OTHER): Payer: Self-pay

## 2023-03-10 DIAGNOSIS — I442 Atrioventricular block, complete: Secondary | ICD-10-CM

## 2023-03-10 LAB — CUP PACEART REMOTE DEVICE CHECK
Battery Remaining Longevity: 54 mo
Battery Remaining Percentage: 55 %
Battery Voltage: 2.98 V
Brady Statistic AP VP Percent: 80 %
Brady Statistic AP VS Percent: 1 %
Brady Statistic AS VP Percent: 20 %
Brady Statistic AS VS Percent: 1 %
Brady Statistic RA Percent Paced: 75 %
Brady Statistic RV Percent Paced: 99 %
Date Time Interrogation Session: 20240517022042
Implantable Lead Connection Status: 753985
Implantable Lead Connection Status: 753985
Implantable Lead Implant Date: 20201107
Implantable Lead Implant Date: 20201107
Implantable Lead Location: 753859
Implantable Lead Location: 753860
Implantable Pulse Generator Implant Date: 20201107
Lead Channel Impedance Value: 440 Ohm
Lead Channel Impedance Value: 480 Ohm
Lead Channel Pacing Threshold Amplitude: 0.75 V
Lead Channel Pacing Threshold Amplitude: 1 V
Lead Channel Pacing Threshold Pulse Width: 0.5 ms
Lead Channel Pacing Threshold Pulse Width: 0.5 ms
Lead Channel Sensing Intrinsic Amplitude: 12 mV
Lead Channel Sensing Intrinsic Amplitude: 3.7 mV
Lead Channel Setting Pacing Amplitude: 2 V
Lead Channel Setting Pacing Amplitude: 2.5 V
Lead Channel Setting Pacing Pulse Width: 0.5 ms
Lead Channel Setting Sensing Sensitivity: 4 mV
Pulse Gen Model: 2272
Pulse Gen Serial Number: 9175795

## 2023-03-22 NOTE — Progress Notes (Signed)
Remote pacemaker transmission.   

## 2023-06-09 ENCOUNTER — Ambulatory Visit (INDEPENDENT_AMBULATORY_CARE_PROVIDER_SITE_OTHER): Payer: Self-pay

## 2023-06-09 DIAGNOSIS — I442 Atrioventricular block, complete: Secondary | ICD-10-CM

## 2023-06-09 LAB — CUP PACEART REMOTE DEVICE CHECK
Battery Remaining Longevity: 50 mo
Battery Remaining Percentage: 52 %
Battery Voltage: 2.98 V
Brady Statistic AP VP Percent: 77 %
Brady Statistic AP VS Percent: 1 %
Brady Statistic AS VP Percent: 23 %
Brady Statistic AS VS Percent: 1 %
Brady Statistic RA Percent Paced: 72 %
Brady Statistic RV Percent Paced: 99 %
Date Time Interrogation Session: 20240816020015
Implantable Lead Connection Status: 753985
Implantable Lead Connection Status: 753985
Implantable Lead Implant Date: 20201107
Implantable Lead Implant Date: 20201107
Implantable Lead Location: 753859
Implantable Lead Location: 753860
Implantable Pulse Generator Implant Date: 20201107
Lead Channel Impedance Value: 410 Ohm
Lead Channel Impedance Value: 480 Ohm
Lead Channel Pacing Threshold Amplitude: 0.75 V
Lead Channel Pacing Threshold Amplitude: 1 V
Lead Channel Pacing Threshold Pulse Width: 0.5 ms
Lead Channel Pacing Threshold Pulse Width: 0.5 ms
Lead Channel Sensing Intrinsic Amplitude: 12 mV
Lead Channel Sensing Intrinsic Amplitude: 3.2 mV
Lead Channel Setting Pacing Amplitude: 2 V
Lead Channel Setting Pacing Amplitude: 2.5 V
Lead Channel Setting Pacing Pulse Width: 0.5 ms
Lead Channel Setting Sensing Sensitivity: 4 mV
Pulse Gen Model: 2272
Pulse Gen Serial Number: 9175795

## 2023-06-19 NOTE — Progress Notes (Signed)
Remote pacemaker transmission.   

## 2023-09-08 ENCOUNTER — Ambulatory Visit (INDEPENDENT_AMBULATORY_CARE_PROVIDER_SITE_OTHER): Payer: Self-pay

## 2023-09-08 DIAGNOSIS — I442 Atrioventricular block, complete: Secondary | ICD-10-CM

## 2023-09-09 LAB — CUP PACEART REMOTE DEVICE CHECK
Battery Remaining Longevity: 47 mo
Battery Remaining Percentage: 49 %
Battery Voltage: 2.98 V
Brady Statistic AP VP Percent: 76 %
Brady Statistic AP VS Percent: 1 %
Brady Statistic AS VP Percent: 24 %
Brady Statistic AS VS Percent: 1 %
Brady Statistic RA Percent Paced: 71 %
Brady Statistic RV Percent Paced: 99 %
Date Time Interrogation Session: 20241115020015
Implantable Lead Connection Status: 753985
Implantable Lead Connection Status: 753985
Implantable Lead Implant Date: 20201107
Implantable Lead Implant Date: 20201107
Implantable Lead Location: 753859
Implantable Lead Location: 753860
Implantable Pulse Generator Implant Date: 20201107
Lead Channel Impedance Value: 410 Ohm
Lead Channel Impedance Value: 450 Ohm
Lead Channel Pacing Threshold Amplitude: 0.75 V
Lead Channel Pacing Threshold Amplitude: 1 V
Lead Channel Pacing Threshold Pulse Width: 0.5 ms
Lead Channel Pacing Threshold Pulse Width: 0.5 ms
Lead Channel Sensing Intrinsic Amplitude: 12 mV
Lead Channel Sensing Intrinsic Amplitude: 2.9 mV
Lead Channel Setting Pacing Amplitude: 2 V
Lead Channel Setting Pacing Amplitude: 2.5 V
Lead Channel Setting Pacing Pulse Width: 0.5 ms
Lead Channel Setting Sensing Sensitivity: 4 mV
Pulse Gen Model: 2272
Pulse Gen Serial Number: 9175795

## 2023-09-20 NOTE — Progress Notes (Signed)
Remote pacemaker transmission.   

## 2023-12-08 ENCOUNTER — Ambulatory Visit (INDEPENDENT_AMBULATORY_CARE_PROVIDER_SITE_OTHER): Payer: Self-pay

## 2023-12-08 DIAGNOSIS — I442 Atrioventricular block, complete: Secondary | ICD-10-CM

## 2023-12-09 LAB — CUP PACEART REMOTE DEVICE CHECK
Battery Remaining Longevity: 44 mo
Battery Remaining Percentage: 46 %
Battery Voltage: 2.98 V
Brady Statistic AP VP Percent: 75 %
Brady Statistic AP VS Percent: 1 %
Brady Statistic AS VP Percent: 25 %
Brady Statistic AS VS Percent: 1 %
Brady Statistic RA Percent Paced: 70 %
Brady Statistic RV Percent Paced: 99 %
Date Time Interrogation Session: 20250214065849
Implantable Lead Connection Status: 753985
Implantable Lead Connection Status: 753985
Implantable Lead Implant Date: 20201107
Implantable Lead Implant Date: 20201107
Implantable Lead Location: 753859
Implantable Lead Location: 753860
Implantable Pulse Generator Implant Date: 20201107
Lead Channel Impedance Value: 430 Ohm
Lead Channel Impedance Value: 450 Ohm
Lead Channel Pacing Threshold Amplitude: 0.75 V
Lead Channel Pacing Threshold Amplitude: 1 V
Lead Channel Pacing Threshold Pulse Width: 0.5 ms
Lead Channel Pacing Threshold Pulse Width: 0.5 ms
Lead Channel Sensing Intrinsic Amplitude: 12 mV
Lead Channel Sensing Intrinsic Amplitude: 3.1 mV
Lead Channel Setting Pacing Amplitude: 2 V
Lead Channel Setting Pacing Amplitude: 2.5 V
Lead Channel Setting Pacing Pulse Width: 0.5 ms
Lead Channel Setting Sensing Sensitivity: 4 mV
Pulse Gen Model: 2272
Pulse Gen Serial Number: 9175795

## 2023-12-13 ENCOUNTER — Encounter: Payer: Self-pay | Admitting: Cardiology

## 2024-01-09 NOTE — Progress Notes (Signed)
 Remote pacemaker transmission.

## 2024-01-09 NOTE — Addendum Note (Signed)
 Addended by: Elease Etienne A on: 01/09/2024 09:45 AM   Modules accepted: Orders

## 2024-02-25 NOTE — Progress Notes (Unsigned)
  Electrophysiology Office Follow up Visit Note:    Date:  02/26/2024   ID:  Makayla Atkins, DOB [birth year], MRN [id number]  PCP:  Fay Hoop, MD  [facility] Cardiologist:  None  [facility] Electrophysiologist:  Boyce Byes, MD    Interval History:     Makayla Atkins is a 89 y.o. female who presents for a follow up visit.   The patient was last seen in our clinic in October 2022 by [facility].  She was previously followed by Dr. Nunzio Belch.  She has symptomatic bradycardia with a permanent pacemaker in place.  She also has a history of hypertension, hyperlipidemia, atrial flutter.  They are from [place].  She is with her daughter today in clinic who interprets during the visit.  The patient's daughter reports that her mom has felt fatigue over the last few weeks and months.  This is associated with some swelling in her lower extremities to the mid shin.  No syncope or presyncope.  The patient stopped taking Xarelto  because of excessive costs and switch to aspirin  81 mg by mouth once daily.      Past medical, surgical, social and family history were reviewed.  ROS:   Please see the history of present illness.    All other systems reviewed and are negative.  EKGs/Labs/Other Studies Reviewed:    The following studies were reviewed today:  Feb 26, 2024 in-clinic device interrogation personally reviewed AF burden 8.5% Battery and lead parameter stable No programming changes made today Presenting rhythm atrial fibrillation        Physical Exam:    VS:  BP 116/80   Pulse 64   Ht 5\' 5"  (1.651 m)   Wt 170 lb (77.1 kg)   SpO2 97%   BMI 28.29 kg/m     Wt Readings from Last 3 Encounters:  02/26/24 170 lb (77.1 kg)  07/28/21 159 lb (72.1 kg)  07/28/21 159 lb 12.8 oz (72.5 kg)     GEN: no distress.  Elderly CARD: Irregularly irregular, No MRG.  Pacemaker pocket well-healed.  Trace pitting bilateral lower extremity edema to the ankles. RESP: No IWOB. CTAB.       ASSESSMENT:    1. Complete heart block (HCC)   2. Cardiac pacemaker in situ   3. Primary hypertension   4. Atrial flutter, unspecified type [facility])    PLAN:    In order of problems listed above:  #Symptomatic bradycardia #Permanent pacemaker in situ Pacemaker was implanted August 31, 2019, [facility] Jude device Continue remote monitoring  #Hypertension At goal today.  Recommend checking blood pressures 1-2 times per week at home and recording the values.  Recommend bringing these recordings to the primary care physician. Continue metoprolol   #Atrial flutter Continue aspirin  81 mg by mouth once daily.  We discussed the risk of stroke not taking therapeutic anticoagulation during today's clinic appointment and the patient is interested in staying on aspirin .  #Fatigue, swelling Concerning for LV dysfunction. I will order an echocardiogram today. Check CBC, CMP today to rule out metabolic or anemic because of fatigue.  Follow-up with APP in 2 weeks to review the above results.  If swelling still a problem and CMP allows, start as needed diuretic.  Signed, Harvie Liner, MD, [facility], [facility] 02/26/2024 12:45 PM    Electrophysiology [facility]

## 2024-02-26 ENCOUNTER — Other Ambulatory Visit: Payer: Self-pay

## 2024-02-26 ENCOUNTER — Encounter: Payer: Self-pay | Admitting: Cardiology

## 2024-02-26 ENCOUNTER — Ambulatory Visit: Payer: Self-pay | Attending: Cardiology | Admitting: Cardiology

## 2024-02-26 VITALS — BP 116/80 | HR 64 | Ht 65.0 in | Wt 170.0 lb

## 2024-02-26 DIAGNOSIS — I10 Essential (primary) hypertension: Secondary | ICD-10-CM

## 2024-02-26 DIAGNOSIS — Z950 Presence of cardiac pacemaker: Secondary | ICD-10-CM

## 2024-02-26 DIAGNOSIS — I442 Atrioventricular block, complete: Secondary | ICD-10-CM

## 2024-02-26 DIAGNOSIS — I4892 Unspecified atrial flutter: Secondary | ICD-10-CM

## 2024-02-26 LAB — CUP PACEART INCLINIC DEVICE CHECK
Battery Remaining Longevity: 39 mo
Battery Voltage: 2.98 V
Brady Statistic RA Percent Paced: 69 %
Brady Statistic RV Percent Paced: 99.4 %
Date Time Interrogation Session: 20250505132810
Implantable Lead Connection Status: 753985
Implantable Lead Connection Status: 753985
Implantable Lead Implant Date: 20201107
Implantable Lead Implant Date: 20201107
Implantable Lead Location: 753859
Implantable Lead Location: 753860
Implantable Pulse Generator Implant Date: 20201107
Lead Channel Impedance Value: 412.5 Ohm
Lead Channel Impedance Value: 475 Ohm
Lead Channel Pacing Threshold Amplitude: 0.5 V
Lead Channel Pacing Threshold Amplitude: 0.5 V
Lead Channel Pacing Threshold Amplitude: 2.5 V
Lead Channel Pacing Threshold Amplitude: 2.5 V
Lead Channel Pacing Threshold Pulse Width: 0.5 ms
Lead Channel Pacing Threshold Pulse Width: 0.5 ms
Lead Channel Pacing Threshold Pulse Width: 0.5 ms
Lead Channel Pacing Threshold Pulse Width: 0.5 ms
Lead Channel Sensing Intrinsic Amplitude: 1.7 mV
Lead Channel Sensing Intrinsic Amplitude: 12 mV
Lead Channel Setting Pacing Amplitude: 2 V
Lead Channel Setting Pacing Amplitude: 2.5 V
Lead Channel Setting Pacing Pulse Width: 0.5 ms
Lead Channel Setting Sensing Sensitivity: 4 mV
Pulse Gen Model: 2272
Pulse Gen Serial Number: 9175795

## 2024-02-26 NOTE — Patient Instructions (Addendum)
 Medication Instructions:  Your physician recommends that you continue on your current medications as directed. Please refer to the Current Medication list given to you today.  *If you need a refill on your cardiac medications before your next appointment, please call your pharmacy*  Lab Work: TODAY: CBC and CMET If you have labs (blood work) drawn today and your tests are completely normal, you will receive your results only by: MyChart Message (if you have MyChart) OR A paper copy in the mail If you have any lab test that is abnormal or we need to change your treatment, we will call you to review the results.  Tests/Procedures Your physician has requested that you have an echocardiogram. Echocardiography is a painless test that uses sound waves to create images of your heart. It provides your doctor with information about the size and shape of your heart and how well your heart's chambers and valves are working. This procedure takes approximately one hour. There are no restrictions for this procedure. Please do NOT wear cologne, perfume, aftershave, or lotions (deodorant is allowed). Please arrive 15 minutes prior to your appointment time.  Please note: We ask at that you not bring children with you during ultrasound (echo/ vascular) testing. Due to room size and safety concerns, children are not allowed in the ultrasound rooms during exams. Our front office staff cannot provide observation of children in our lobby area while testing is being conducted. An adult accompanying a patient to their appointment will only be allowed in the ultrasound room at the discretion of the ultrasound technician under special circumstances. We apologize for any inconvenience.   Follow-Up: At [facility], you and your health needs are our priority.  As part of our continuing mission to provide you with exceptional heart care, our providers are all part of one team.  This team includes your primary  Cardiologist (physician) and Advanced Practice Providers or APPs (Physician Assistants and Nurse Practitioners) who all work together to provide you with the care you need, when you need it.  Your next appointment:   2 weeks  Provider:   You will see one of the following Advanced Practice Providers on your designated Care Team:   Mertha Abrahams, Kennard Pea [address]" [place], PA-C Suzann Riddle, NP Creighton Doffing, NP

## 2024-02-27 LAB — COMPREHENSIVE METABOLIC PANEL WITH GFR
ALT: 14 IU/L (ref 0–32)
AST: 32 IU/L (ref 0–40)
Albumin: 4.2 g/dL (ref 3.6–4.6)
Alkaline Phosphatase: 68 IU/L (ref 44–121)
BUN/Creatinine Ratio: 13 (ref 12–28)
BUN: 11 mg/dL (ref 10–36)
Bilirubin Total: 1 mg/dL (ref 0.0–1.2)
CO2: 25 mmol/L (ref 20–29)
Calcium: 9.6 mg/dL (ref 8.7–10.3)
Chloride: 104 mmol/L (ref 96–106)
Creatinine, Ser: 0.88 mg/dL (ref 0.57–1.00)
Globulin, Total: 2.4 g/dL (ref 1.5–4.5)
Glucose: 123 mg/dL — ABNORMAL HIGH (ref 70–99)
Potassium: 4.5 mmol/L (ref 3.5–5.2)
Sodium: 144 mmol/L (ref 134–144)
Total Protein: 6.6 g/dL (ref 6.0–8.5)
eGFR: 62 mL/min/{1.73_m2} (ref >59)

## 2024-02-27 LAB — CBC
Hematocrit: 36.1 % (ref 34.0–46.6)
Hemoglobin: 11.8 g/dL (ref 11.1–15.9)
MCH: 29.5 pg (ref 26.6–33.0)
MCHC: 32.7 g/dL (ref 31.5–35.7)
MCV: 90 fL (ref 79–97)
Platelets: 150 10*3/uL (ref 150–450)
RBC: 4 x10E6/uL (ref 3.77–5.28)
RDW: 13.5 % (ref 11.7–15.4)
WBC: 5.7 10*3/uL (ref 3.4–10.8)

## 2024-03-08 ENCOUNTER — Ambulatory Visit (INDEPENDENT_AMBULATORY_CARE_PROVIDER_SITE_OTHER): Payer: Self-pay

## 2024-03-08 DIAGNOSIS — I442 Atrioventricular block, complete: Secondary | ICD-10-CM

## 2024-03-08 LAB — CUP PACEART REMOTE DEVICE CHECK
Battery Remaining Longevity: 43 mo
Battery Remaining Percentage: 43 %
Battery Voltage: 2.98 V
Brady Statistic AP VP Percent: 10 %
Brady Statistic AP VS Percent: 0 %
Brady Statistic AS VP Percent: 89 %
Brady Statistic AS VS Percent: 1 %
Brady Statistic RA Percent Paced: 1 %
Brady Statistic RV Percent Paced: 96 %
Date Time Interrogation Session: 20250516020013
Implantable Lead Connection Status: 753985
Implantable Lead Connection Status: 753985
Implantable Lead Implant Date: 20201107
Implantable Lead Implant Date: 20201107
Implantable Lead Location: 753859
Implantable Lead Location: 753860
Implantable Pulse Generator Implant Date: 20201107
Lead Channel Impedance Value: 400 Ohm
Lead Channel Impedance Value: 450 Ohm
Lead Channel Pacing Threshold Amplitude: 0.5 V
Lead Channel Pacing Threshold Amplitude: 2.5 V
Lead Channel Pacing Threshold Pulse Width: 0.5 ms
Lead Channel Pacing Threshold Pulse Width: 0.5 ms
Lead Channel Sensing Intrinsic Amplitude: 12 mV
Lead Channel Sensing Intrinsic Amplitude: 3.3 mV
Lead Channel Setting Pacing Amplitude: 2 V
Lead Channel Setting Pacing Amplitude: 2.5 V
Lead Channel Setting Pacing Pulse Width: 0.5 ms
Lead Channel Setting Sensing Sensitivity: 4 mV
Pulse Gen Model: 2272
Pulse Gen Serial Number: 9175795

## 2024-03-10 ENCOUNTER — Ambulatory Visit: Payer: Self-pay | Admitting: Cardiology

## 2024-03-12 ENCOUNTER — Encounter: Payer: Self-pay | Admitting: Pulmonary Disease

## 2024-03-13 ENCOUNTER — Ambulatory Visit: Payer: Self-pay | Attending: Pulmonary Disease | Admitting: Pulmonary Disease

## 2024-03-13 NOTE — Progress Notes (Deleted)
  Electrophysiology Office Note:   Date:  03/13/2024  ID:  Makayla Atkins, DOB 02-Apr-1930, MRN [id number]  Primary Cardiologist: None Primary Heart Failure: None Electrophysiologist: Boyce Byes, MD   {Click to update primary MD,subspecialty MD or APP then REFRESH:1}    History of Present Illness:   Makayla Atkins is a 89 y.o. female with h/o *** seen today for routine electrophysiology followup.   Since last being seen in our clinic the patient reports doing ***.  she denies chest pain, palpitations, dyspnea, PND, orthopnea, nausea, vomiting, dizziness, syncope, edema, weight gain, or early satiety.   Review of systems complete and found to be negative unless listed in HPI.    EP Information / Studies Reviewed:    EKG is ordered today. Personal review as below.      PPM Interrogation-  reviewed in detail today,  See PACEART report.  Device History: Abbott Dual Chamber PPM implanted 08/31/2019 for Symptomatic bradycardia  Risk Assessment/Calculations:    CHA2DS2-VASc Score =    {Click here to calculate score.  REFRESH note before signing. :1} This indicates a  % annual risk of stroke. The patient's score is based upon:      No BP recorded.  {Refresh Note OR Click here to enter BP  :1}***        Physical Exam:   VS:  There were no vitals taken for this visit.   Wt Readings from Last 3 Encounters:  02/26/24 170 lb (77.1 kg)  07/28/21 159 lb (72.1 kg)  07/28/21 159 lb 12.8 oz (72.5 kg)     GEN: Well nourished, well developed in no acute distress NECK: No JVD; No carotid bruits CARDIAC: {EPRHYTHM:28826}, no murmurs, rubs, gallops RESPIRATORY:  Clear to auscultation without rales, wheezing or rhonchi  ABDOMEN: Soft, non-tender, non-distended EXTREMITIES:  No edema; No deformity   ASSESSMENT AND PLAN:    {Blank single:19197::"SND","CHB","Second Degree AV block","Tachy-Brady syndrome","Symptomatic bradycardia","Uncontrolled atrial arrhyhtmia s/p AV node ablation"}  s/p {INDUSTRY:28136} PPM  Normal PPM function See Pace Art report No changes today  Disposition:   Follow up with {EPPROVIDERS:28135} {EPFOLLOW BJ:47829}  Signed, Creighton Doffing, NP

## 2024-03-14 ENCOUNTER — Encounter: Payer: Self-pay | Admitting: Pulmonary Disease

## 2024-03-29 ENCOUNTER — Ambulatory Visit (HOSPITAL_COMMUNITY): Payer: Self-pay

## 2024-04-15 NOTE — Progress Notes (Signed)
 Remote pacemaker transmission.

## 2024-04-24 ENCOUNTER — Ambulatory Visit: Payer: Self-pay

## 2024-05-20 ENCOUNTER — Other Ambulatory Visit (HOSPITAL_COMMUNITY): Payer: Self-pay

## 2024-06-07 ENCOUNTER — Ambulatory Visit (INDEPENDENT_AMBULATORY_CARE_PROVIDER_SITE_OTHER): Payer: Self-pay

## 2024-06-07 DIAGNOSIS — I442 Atrioventricular block, complete: Secondary | ICD-10-CM

## 2024-06-10 LAB — CUP PACEART REMOTE DEVICE CHECK
Battery Remaining Longevity: 40 mo
Battery Remaining Percentage: 40 %
Battery Voltage: 2.96 V
Brady Statistic AP VP Percent: 63 %
Brady Statistic AP VS Percent: 1 %
Brady Statistic AS VP Percent: 37 %
Brady Statistic AS VS Percent: 1 %
Brady Statistic RA Percent Paced: 57 %
Brady Statistic RV Percent Paced: 99 %
Date Time Interrogation Session: 20250815020015
Implantable Lead Connection Status: 753985
Implantable Lead Connection Status: 753985
Implantable Lead Implant Date: 20201107
Implantable Lead Implant Date: 20201107
Implantable Lead Location: 753859
Implantable Lead Location: 753860
Implantable Pulse Generator Implant Date: 20201107
Lead Channel Impedance Value: 440 Ohm
Lead Channel Impedance Value: 460 Ohm
Lead Channel Pacing Threshold Amplitude: 0.5 V
Lead Channel Pacing Threshold Amplitude: 2.5 V
Lead Channel Pacing Threshold Pulse Width: 0.5 ms
Lead Channel Pacing Threshold Pulse Width: 0.5 ms
Lead Channel Sensing Intrinsic Amplitude: 12 mV
Lead Channel Sensing Intrinsic Amplitude: 2.7 mV
Lead Channel Setting Pacing Amplitude: 2 V
Lead Channel Setting Pacing Amplitude: 2.5 V
Lead Channel Setting Pacing Pulse Width: 0.5 ms
Lead Channel Setting Sensing Sensitivity: 4 mV
Pulse Gen Model: 2272
Pulse Gen Serial Number: 9175795

## 2024-06-14 ENCOUNTER — Ambulatory Visit: Payer: Self-pay | Admitting: Cardiology

## 2024-07-11 ENCOUNTER — Encounter: Payer: Self-pay | Admitting: Cardiology

## 2024-07-17 NOTE — Progress Notes (Signed)
 Remote PPM Transmission

## 2024-08-12 ENCOUNTER — Encounter: Payer: Self-pay | Admitting: Cardiology

## 2024-09-06 ENCOUNTER — Ambulatory Visit: Payer: Self-pay

## 2024-09-06 DIAGNOSIS — I4892 Unspecified atrial flutter: Secondary | ICD-10-CM

## 2024-09-08 LAB — CUP PACEART REMOTE DEVICE CHECK
Battery Remaining Longevity: 36 mo
Battery Remaining Percentage: 38 %
Battery Voltage: 2.96 V
Brady Statistic AP VP Percent: 72 %
Brady Statistic AP VS Percent: 1 %
Brady Statistic AS VP Percent: 28 %
Brady Statistic AS VS Percent: 1 %
Brady Statistic RA Percent Paced: 68 %
Brady Statistic RV Percent Paced: 99 %
Date Time Interrogation Session: 20251114020017
Implantable Lead Connection Status: 753985
Implantable Lead Connection Status: 753985
Implantable Lead Implant Date: 20201107
Implantable Lead Implant Date: 20201107
Implantable Lead Location: 753859
Implantable Lead Location: 753860
Implantable Pulse Generator Implant Date: 20201107
Lead Channel Impedance Value: 440 Ohm
Lead Channel Impedance Value: 480 Ohm
Lead Channel Pacing Threshold Amplitude: 0.5 V
Lead Channel Pacing Threshold Amplitude: 2.5 V
Lead Channel Pacing Threshold Pulse Width: 0.5 ms
Lead Channel Pacing Threshold Pulse Width: 0.5 ms
Lead Channel Sensing Intrinsic Amplitude: 12 mV
Lead Channel Sensing Intrinsic Amplitude: 2.6 mV
Lead Channel Setting Pacing Amplitude: 2 V
Lead Channel Setting Pacing Amplitude: 2.5 V
Lead Channel Setting Pacing Pulse Width: 0.5 ms
Lead Channel Setting Sensing Sensitivity: 4 mV
Pulse Gen Model: 2272
Pulse Gen Serial Number: 9175795

## 2024-09-10 ENCOUNTER — Ambulatory Visit: Payer: Self-pay | Admitting: Cardiology

## 2024-09-10 NOTE — Progress Notes (Signed)
 Remote PPM Transmission

## 2025-03-07 ENCOUNTER — Ambulatory Visit: Payer: Self-pay

## 2025-06-06 ENCOUNTER — Ambulatory Visit: Payer: Self-pay

## 2025-09-05 ENCOUNTER — Ambulatory Visit: Payer: Self-pay

## 2025-12-05 ENCOUNTER — Ambulatory Visit: Payer: Self-pay

## 2026-03-06 ENCOUNTER — Ambulatory Visit: Payer: Self-pay
